# Patient Record
Sex: Male | Born: 1955 | Race: Black or African American | Hispanic: No | Marital: Single | Smoking: Former smoker
Health system: Southern US, Community
[De-identification: ages and names within clinical notes are randomized; demographics above are authoritative.]

## PROBLEM LIST (undated history)

## (undated) DIAGNOSIS — M109 Gout, unspecified: Secondary | ICD-10-CM

## (undated) DIAGNOSIS — I4891 Unspecified atrial fibrillation: Secondary | ICD-10-CM

## (undated) HISTORY — DX: Unspecified atrial fibrillation: I48.91

---

## 2019-02-15 ENCOUNTER — Other Ambulatory Visit: Payer: Self-pay

## 2019-02-15 ENCOUNTER — Emergency Department (HOSPITAL_COMMUNITY)
Admission: EM | Admit: 2019-02-15 | Discharge: 2019-02-15 | Disposition: A | Discharge status: Home / Self Care | Payer: Commercial Managed Care - PPO | Attending: Emergency Medicine | Admitting: Emergency Medicine

## 2019-02-15 ENCOUNTER — Encounter (HOSPITAL_COMMUNITY): Payer: Self-pay | Admitting: *Deleted

## 2019-02-15 DIAGNOSIS — M109 Gout, unspecified: Secondary | ICD-10-CM

## 2019-02-15 DIAGNOSIS — M10072 Idiopathic gout, left ankle and foot: Secondary | ICD-10-CM | POA: Diagnosis not present

## 2019-02-15 DIAGNOSIS — M79675 Pain in left toe(s): Secondary | ICD-10-CM | POA: Diagnosis present

## 2019-02-15 DIAGNOSIS — Z87891 Personal history of nicotine dependence: Secondary | ICD-10-CM | POA: Diagnosis not present

## 2019-02-15 MED ORDER — PREDNISONE 20 MG PO TABS: ORAL_TABLET | ORAL | 0 refills | Status: DC

## 2019-02-15 MED ORDER — COLCHICINE 0.6 MG PO TABS
1.2000 mg | ORAL_TABLET | Freq: Once | ORAL | Status: AC
  Administered 2019-02-15: 1.2 mg via ORAL
  Filled 2019-02-15: qty 2

## 2019-02-15 MED ORDER — PREDNISONE 20 MG PO TABS
60.0000 mg | ORAL_TABLET | Freq: Once | ORAL | Status: AC
  Administered 2019-02-15: 60 mg via ORAL
  Filled 2019-02-15: qty 3

## 2019-02-15 MED ORDER — OXYCODONE HCL 5 MG PO TABS
5.0000 mg | ORAL_TABLET | Freq: Once | ORAL | Status: AC
  Administered 2019-02-15: 5 mg via ORAL
  Filled 2019-02-15: qty 1

## 2019-02-15 MED ORDER — COLCHICINE 0.6 MG PO TABS: 0.6000 mg | ORAL_TABLET | Freq: Once | ORAL | 0 refills | Status: DC

## 2019-02-15 NOTE — ED Triage Notes (Signed)
Pt reports foot swelling on Friday. Pain is 8/10

## 2019-02-15 NOTE — ED Provider Notes (Signed)
[facility] EMERGENCY DEPARTMENT Provider Note   CSN: [id number] Arrival date & time: 02/15/19  1632    History   Chief Complaint Chief Complaint  Patient presents with  . Foot Pain    HPI James Sanders is a 63 y.o. male.     63 yo M with a chief complaint of left great toe pain.  This been going on for the past couple days actually gotten mildly better.  He thought about talking about it with his family doctor but by the time he thought about mentioning to the it was at the end of the day.  He denies fevers or chills denies trauma.  No history of gout previously but brother is a history of gout.  Was recently in the hospital about a month ago and discharged at [facility].  Has not yet followed up with his family doctor.   The history is provided by the patient.  Foot Pain  This is a new problem. The current episode started 2 days ago. The problem occurs constantly. The problem has been gradually improving. Pertinent negatives include no chest pain, no abdominal pain, no headaches and no shortness of breath. The symptoms are aggravated by bending and twisting. Nothing relieves the symptoms. He has tried nothing for the symptoms. The treatment provided no relief.    History reviewed. No pertinent past medical history.  There are no active problems to display for this patient.   History reviewed. No pertinent surgical history.      Home Medications    Prior to Admission medications   Medication Sig Start Date End Date Taking? Authorizing Provider  colchicine 0.6 MG tablet Take 1 tablet (0.6 mg total) by mouth once for 1 dose. Try to take 1 hour after your first dose here 02/15/19 02/15/19  Melene Plan, DO  predniSONE (DELTASONE) 20 MG tablet 2 tabs po daily x 4 days 02/15/19   Melene Plan, DO    Family History History reviewed. No pertinent family history.  Social History Social History   Tobacco Use  . Smoking status: Former Games developer  . Smokeless tobacco:  Never Used  Substance Use Topics  . Alcohol use: Not Currently  . Drug use: Never     Allergies   Patient has no allergy information on record.   Review of Systems Review of Systems  Constitutional: Negative for chills and fever.  HENT: Negative for congestion and facial swelling.   Eyes: Negative for discharge and visual disturbance.  Respiratory: Negative for shortness of breath.   Cardiovascular: Negative for chest pain and palpitations.  Gastrointestinal: Negative for abdominal pain, diarrhea and vomiting.  Musculoskeletal: Positive for arthralgias and myalgias.  Skin: Negative for color change and rash.  Neurological: Negative for tremors, syncope and headaches.  Psychiatric/Behavioral: Negative for confusion and dysphoric mood.     Physical Exam Updated Vital Signs BP (!) 153/86 (BP Location: Right Arm)   Pulse 91   Temp 97.7 F (36.5 C) (Oral)   Resp 19   Ht 6' (1.829 m)   SpO2 98%   Physical Exam Vitals signs and nursing note reviewed.  Constitutional:      Appearance: He is well-developed.  HENT:     Head: Normocephalic and atraumatic.  Eyes:     Pupils: Pupils are equal, round, and reactive to light.  Neck:     Musculoskeletal: Normal range of motion and neck supple.     Vascular: No JVD.  Cardiovascular:     Rate and  Rhythm: Normal rate and regular rhythm.     Heart sounds: No murmur. No friction rub. No gallop.   Pulmonary:     Effort: No respiratory distress.     Breath sounds: No wheezing.  Abdominal:     General: There is no distension.     Tenderness: There is no guarding or rebound.  Musculoskeletal: Normal range of motion.        General: Swelling and tenderness present.     Comments: Pain to the L MTP.  Mild swelling.  PMS intact distally.   Skin:    Coloration: Skin is not pale.     Findings: No rash.  Neurological:     Mental Status: He is alert and oriented to person, place, and time.  Psychiatric:        Behavior: Behavior  normal.      ED Treatments / Results  Labs (all labs ordered are listed, but only abnormal results are displayed) Labs Reviewed - No data to display  EKG None  Radiology No results found.  Procedures Procedures (including critical care time)  Medications Ordered in ED Medications  colchicine tablet 1.2 mg (1.2 mg Oral Given 02/15/19 1729)  predniSONE (DELTASONE) tablet 60 mg (60 mg Oral Given 02/15/19 1730)  oxyCODONE (Oxy IR/ROXICODONE) immediate release tablet 5 mg (5 mg Oral Given 02/15/19 1730)     Initial Impression / Assessment and Plan / ED Course  I have reviewed the triage vital signs and the nursing notes.  Pertinent labs & imaging results that were available during my care of the patient were reviewed by me and considered in my medical decision making (see chart for details).        63 yo M with a chief complaint of left great toe pain.  Going on for the past few days.  No fevers or chills no redness.  Is actually gotten better over the past day or so.  Likely the patient has gout as it is located in the first MTP of the left foot.  Pain with palpation.  Will start on colchicine, prednisone.  Have him follow-up with PCP.  5:38 PM:  I have discussed the diagnosis/risks/treatment options with the patient and family and believe the pt to be eligible for discharge home to follow-up with PCP. We also discussed returning to the ED immediately if new or worsening sx occur. We discussed the sx which are most concerning (e.g., sudden worsening pain, fever, inability to tolerate by mouth) that necessitate immediate return. Medications administered to the patient during their visit and any new prescriptions provided to the patient are listed below.  Medications given during this visit Medications  colchicine tablet 1.2 mg (1.2 mg Oral Given 02/15/19 1729)  predniSONE (DELTASONE) tablet 60 mg (60 mg Oral Given 02/15/19 1730)  oxyCODONE (Oxy IR/ROXICODONE) immediate release  tablet 5 mg (5 mg Oral Given 02/15/19 1730)     The patient appears reasonably screen and/or stabilized for discharge and I doubt any other medical condition or other [facility] requiring further screening, evaluation, or treatment in the ED at this time prior to discharge.    Final Clinical Impressions(s) / ED Diagnoses   Final diagnoses:  Acute gout involving toe of left foot, unspecified cause    ED Discharge Orders         Ordered    colchicine 0.6 MG tablet   Once     02/15/19 1719    predniSONE (DELTASONE) 20 MG tablet  02/15/19 1719           Melene Plan, DO 02/15/19 1738

## 2019-02-15 NOTE — Discharge Instructions (Signed)
Please establish with a family doctor to have them follow-up with your cardiac history as well as to try and keep track of your recent toe pain.  I am to start you on steroids which should help you with the inflammation.  Please return to the ED for fever rapid spreading redness.

## 2019-04-02 ENCOUNTER — Emergency Department (HOSPITAL_COMMUNITY): Payer: BLUE CROSS/BLUE SHIELD

## 2019-04-02 ENCOUNTER — Other Ambulatory Visit: Payer: Self-pay

## 2019-04-02 ENCOUNTER — Emergency Department (HOSPITAL_COMMUNITY)
Admission: EM | Admit: 2019-04-02 | Discharge: 2019-04-02 | Disposition: A | Discharge status: Home / Self Care | Payer: BLUE CROSS/BLUE SHIELD | Attending: Emergency Medicine | Admitting: Emergency Medicine

## 2019-04-02 ENCOUNTER — Encounter (HOSPITAL_COMMUNITY): Payer: Self-pay | Admitting: Emergency Medicine

## 2019-04-02 DIAGNOSIS — Z79899 Other long term (current) drug therapy: Secondary | ICD-10-CM | POA: Insufficient documentation

## 2019-04-02 DIAGNOSIS — I4891 Unspecified atrial fibrillation: Secondary | ICD-10-CM | POA: Insufficient documentation

## 2019-04-02 DIAGNOSIS — R2231 Localized swelling, mass and lump, right upper limb: Secondary | ICD-10-CM | POA: Insufficient documentation

## 2019-04-02 DIAGNOSIS — M109 Gout, unspecified: Secondary | ICD-10-CM | POA: Insufficient documentation

## 2019-04-02 DIAGNOSIS — Z87891 Personal history of nicotine dependence: Secondary | ICD-10-CM | POA: Diagnosis not present

## 2019-04-02 DIAGNOSIS — M25531 Pain in right wrist: Secondary | ICD-10-CM | POA: Diagnosis present

## 2019-04-02 DIAGNOSIS — M25431 Effusion, right wrist: Secondary | ICD-10-CM

## 2019-04-02 HISTORY — DX: Gout, unspecified: M10.9

## 2019-04-02 HISTORY — PX: IR FLUORO GUIDED NEEDLE PLC ASPIRATION/INJECTION LOC: IMG2395

## 2019-04-02 LAB — APTT: aPTT: 32 seconds (ref 24–36)

## 2019-04-02 LAB — BASIC METABOLIC PANEL
Anion gap: 9 (ref 5–15)
BUN: 16 mg/dL (ref 6–20)
CO2: 25 mmol/L (ref 22–32)
Calcium: 9.4 mg/dL (ref 8.9–10.3)
Chloride: 105 mmol/L (ref 98–111)
Creatinine, Ser: 1.45 mg/dL — ABNORMAL HIGH (ref 0.61–1.24)
GFR calc Af Amer: >60 mL/min (ref >60)
GFR calc non Af Amer: 52 mL/min — ABNORMAL LOW (ref >60)
Glucose, Bld: 124 mg/dL — ABNORMAL HIGH (ref 70–99)
Potassium: 3.9 mmol/L (ref 3.5–5.1)
Sodium: 139 mmol/L (ref 135–145)

## 2019-04-02 LAB — SEDIMENTATION RATE: Sed Rate: 25 mm/hr — ABNORMAL HIGH (ref 0–16)

## 2019-04-02 LAB — CBC WITH DIFFERENTIAL/PLATELET
Abs Immature Granulocytes: 0.03 10*3/uL (ref 0.00–0.07)
Basophils Absolute: 0.1 10*3/uL (ref 0.0–0.1)
Basophils Relative: 1 %
Eosinophils Absolute: 0.2 10*3/uL (ref 0.0–0.5)
Eosinophils Relative: 2 %
HCT: 38.8 % — ABNORMAL LOW (ref 39.0–52.0)
Hemoglobin: 12.7 g/dL — ABNORMAL LOW (ref 13.0–17.0)
Immature Granulocytes: 0 %
Lymphocytes Relative: 29 %
Lymphs Abs: 2 10*3/uL (ref 0.7–4.0)
MCH: 28.9 pg (ref 26.0–34.0)
MCHC: 32.7 g/dL (ref 30.0–36.0)
MCV: 88.4 fL (ref 80.0–100.0)
Monocytes Absolute: 0.8 10*3/uL (ref 0.1–1.0)
Monocytes Relative: 11 %
Neutro Abs: 3.7 10*3/uL (ref 1.7–7.7)
Neutrophils Relative %: 57 %
Platelets: 179 10*3/uL (ref 150–400)
RBC: 4.39 MIL/uL (ref 4.22–5.81)
RDW: 13.8 % (ref 11.5–15.5)
WBC: 6.7 10*3/uL (ref 4.0–10.5)
nRBC: 0 % (ref 0.0–0.2)

## 2019-04-02 LAB — URIC ACID: Uric Acid, Serum: 9.7 mg/dL — ABNORMAL HIGH (ref 3.7–8.6)

## 2019-04-02 LAB — C-REACTIVE PROTEIN: CRP: <0.8 mg/dL (ref <1.0)

## 2019-04-02 LAB — PROTIME-INR
INR: 2 — ABNORMAL HIGH (ref 0.8–1.2)
Prothrombin Time: 22.7 seconds — ABNORMAL HIGH (ref 11.4–15.2)

## 2019-04-02 LAB — LACTIC ACID, PLASMA: Lactic Acid, Venous: 1.4 mmol/L (ref 0.5–1.9)

## 2019-04-02 MED ORDER — HYDROCODONE-ACETAMINOPHEN 5-325 MG PO TABS: 1.0000 | ORAL_TABLET | Freq: Four times a day (QID) | ORAL | 0 refills | Status: DC | PRN

## 2019-04-02 MED ORDER — LIDOCAINE HCL 1 % IJ SOLN
INTRAMUSCULAR | Status: DC | PRN
  Administered 2019-04-02: 1 mL

## 2019-04-02 MED ORDER — HYDROCODONE-ACETAMINOPHEN 5-325 MG PO TABS
1.0000 | ORAL_TABLET | Freq: Once | ORAL | Status: AC
  Administered 2019-04-02: 1 via ORAL
  Filled 2019-04-02: qty 1

## 2019-04-02 MED ORDER — LIDOCAINE HCL 1 % IJ SOLN
INTRAMUSCULAR | Status: AC
  Filled 2019-04-02: qty 20

## 2019-04-02 MED ORDER — PREDNISONE 20 MG PO TABS: 40.0000 mg | ORAL_TABLET | Freq: Every day | ORAL | 0 refills | Status: DC

## 2019-04-02 NOTE — Discharge Instructions (Signed)
It was my pleasure taking care of you today!   Take prednisone daily.  Norco only as needed for severe pain.   If you are not feeling better by Monday, call your primary care doctor.  You can always return to ER if needed. Please return should you develop a fever, swelling is getting worse or you have any additional concerns.

## 2019-04-02 NOTE — ED Provider Notes (Signed)
[facility] EMERGENCY DEPARTMENT Provider Note   CSN: [id number] Arrival date & time: 04/02/19  1059  History   Chief Complaint Right wrist pain  HPI James Sanders is a 63 y.o. male with past medical history significant for Afib (Xarelto) who presents for evaluation of wrist pain. Pain with 2 days of right wrist pain, swelling and warmth x 2 days. Denies injury or trauma. Does have history of gout in left great toe x 1. Denies fever, chills, nausea, vomiting, numbness or tingling, CP, SOB, Cough, ABD pain, bilateral joint pain, discharge. States he has decreased ROM to his right wrist since the pain began. Rates his pain a 7/10. Denies radiation of pain. Pain located to dorsal aspect of right wrist. Has not taken anything for his pain. Symptoms have been constant in nature.  History obtained from patient. No interpretor was used.     HPI  Past Medical History:  Diagnosis Date   Gout     There are no active problems to display for this patient.   No past surgical history on file.      Home Medications    Prior to Admission medications   Medication Sig Start Date End Date Taking? Authorizing Provider  amiodarone (PACERONE) 200 MG tablet Take 200 mg by mouth daily. 01/29/19 01/29/20 Yes [provider]  atorvastatin (LIPITOR) 40 MG tablet Take 40 mg by mouth at bedtime. 01/28/19 01/28/20 Yes [provider]  lisinopril (ZESTRIL) 5 MG tablet Take 5 mg by mouth daily. 01/29/19 01/29/20 Yes [provider]  metoprolol succinate (TOPROL-XL) 50 MG 24 hr tablet Take 50 mg by mouth every 12 (twelve) hours. 01/28/19  Yes [provider]  torsemide (DEMADEX) 20 MG tablet Take 20 mg by mouth daily. 01/29/19 01/29/20 Yes [provider]  XARELTO 20 MG TABS tablet Take 20 mg by mouth every evening. 02/16/19  Yes [provider]  colchicine 0.6 MG tablet Take 1 tablet (0.6 mg total) by mouth once for 1 dose. Try to take 1 hour  after your first dose here Patient not taking: Reported on 04/02/2019 02/15/19 02/15/19  Melene Plan, DO    Family History No family history on file.  Social History Social History   Tobacco Use   Smoking status: Former Smoker   Smokeless tobacco: Never Used  Substance Use Topics   Alcohol use: Yes    Comment: social   Drug use: Yes    Types: Marijuana     Allergies   Patient has no known allergies.   Review of Systems Review of Systems  Constitutional: Negative.   HENT: Negative.   Respiratory: Negative.   Cardiovascular: Negative.   Gastrointestinal: Negative.   Genitourinary: Negative.   Musculoskeletal:       Right wrist pain  Skin: Negative.   Neurological: Negative.   All other systems reviewed and are negative.    Physical Exam Updated Vital Signs BP (!) 141/84 (BP Location: Left Arm)    Pulse (!) 43    Temp 98.4 F (36.9 C) (Oral)    Resp 15    SpO2 99%   Physical Exam Vitals signs and nursing note reviewed.  Constitutional:      General: He is not in acute distress.    Appearance: He is well-developed. He is not ill-appearing, toxic-appearing or diaphoretic.  HENT:     Head: Normocephalic and atraumatic.     Mouth/Throat:     Mouth: Mucous membranes are moist.  Pharynx: Oropharynx is clear.  Eyes:     Pupils: Pupils are equal, round, and reactive to light.  Neck:     Musculoskeletal: Normal range of motion and neck supple.  Cardiovascular:     Rate and Rhythm: Normal rate and regular rhythm.     Pulses: Normal pulses.     Heart sounds: Normal heart sounds. No murmur. No friction rub. No gallop.   Pulmonary:     Effort: Pulmonary effort is normal. No respiratory distress.     Breath sounds: Normal breath sounds. No stridor. No wheezing, rhonchi or rales.  Chest:     Chest wall: No tenderness.  Abdominal:     General: Bowel sounds are normal. There is no distension.     Palpations: Abdomen is soft.     Tenderness: There is no abdominal  tenderness. There is no guarding or rebound.  Musculoskeletal: Normal range of motion.     Comments: Decreased ROM to right wrist with flexion and extension. FROM to left wrist without difficulty. Mild swelling to dorsal aspect of right wrist with mild warmth. No bony tenderness to BUE.  Skin:    General: Skin is warm and dry.     Comments: Mild edema with warmth to right wrist.  No overlying fluctuance or induration.  No lacerations, rashes or lesions.  No vesicles or bulla.  Brisk capillary refill.  Neurological:     Mental Status: He is alert.     Comments: Decreased grip strength to right upper extremity secondary to pain. Cranials 2 through 12 grossly intact.  No dysphasia.         ED Treatments / Results  Labs (all labs ordered are listed, but only abnormal results are displayed) Labs Reviewed  CBC WITH DIFFERENTIAL/PLATELET - Abnormal; Notable for the following components:      Result Value   Hemoglobin 12.7 (*)    HCT 38.8 (*)    All other components within normal limits  BASIC METABOLIC PANEL - Abnormal; Notable for the following components:   Glucose, Bld 124 (*)    Creatinine, Ser 1.45 (*)    GFR calc non Af Amer 52 (*)    All other components within normal limits  SEDIMENTATION RATE - Abnormal; Notable for the following components:   Sed Rate 25 (*)    All other components within normal limits  URIC ACID - Abnormal; Notable for the following components:   Uric Acid, Serum 9.7 (*)    All other components within normal limits  PROTIME-INR - Abnormal; Notable for the following components:   Prothrombin Time 22.7 (*)    INR 2.0 (*)    All other components within normal limits  BODY FLUID CULTURE  GRAM STAIN  C-REACTIVE PROTEIN  LACTIC ACID, PLASMA  APTT  LACTIC ACID, PLASMA  GLUCOSE, BODY FLUID OTHER  PROTEIN, BODY FLUID (OTHER)  SYNOVIAL CELL COUNT + DIFF, W/ CRYSTALS  URIC ACID, BODY FLUID    EKG None  Radiology Dg Wrist Complete  Right  Result Date: 04/02/2019 CLINICAL DATA:  Per patient reports general right wrist pain, per patient reports he has had gout in his foot, reports it may have gout in his right wrist. Medial and lateral right wrist pain, limited mobility due to pain. wrist pain EXAM: RIGHT WRIST - COMPLETE 3+ VIEW COMPARISON:  None. FINDINGS: No distal radius or ulnar fracture. Radiocarpal joint is intact. No carpal fracture. No soft tissue abnormality. IMPRESSION: No acute osseous findings.  No significant arthropathy identified.  Electronically Signed   By: Genevive Bi M.D.   On: 04/02/2019 11:31    Procedures Procedures (including critical care time)  Medications Ordered in ED Medications  lidocaine (XYLOCAINE) 1 % (with pres) injection (has no administration in time range)  lidocaine (XYLOCAINE) 1 % (with pres) injection (1 mL Infiltration Given 04/02/19 1558)  HYDROcodone-acetaminophen (NORCO/VICODIN) 5-325 MG per tablet 1 tablet (1 tablet Oral Given 04/02/19 1416)     Initial Impression / Assessment and Plan / ED Course  I have reviewed the triage vital signs and the nursing notes.  Pertinent labs & imaging results that were available during my care of the patient were reviewed by me and considered in my medical decision making (see chart for details).  63 year old male with acute onset swollen, red, warm, painful red wrist.  Afebrile, nonseptic, non-ill-appearing.  Patient is on anticoagulation, Xarelto for persistent PE.  Denies chest 1.14 or shortness of breath.  Does have decreased range of motion with flexion and extension to his right wrist.  On exam there is some mild erythema, mild swelling with warmth compared to left wrist.  He does have history of gout however in his right great toe.  He has never had fluid aspiration performed to confirm gout diagnosis previously.  Heart and lungs clear.  Possible concern for septic joint versus gout flare.  Will obtain labs, consult hand surgery and  reevaluate.  Labs and imaging personally reviewed: CBC without leukocytosis BMP with likely elevated creatine at 1.42, GFR >60 Uric acid elevated at 9.7 Sed rate 25, CRP non elevated, lactic acid 1.4  1130: Consulted with Dr. Janee Morn, Hand surgery and Rodessa PA. Request Fluid collect to assess for septic joint, If positive he request we formally page him for consult and he will evaluate patient.   1500: IR physician and Twanna Hy has evaluated patient with [place]. No fluid collection visualized, unable to aspirate given no visualized fluid collection. No evidence of joint effusion. Low suspicion for septic joint. Likely gout flair.  1550: Notified by Radiology they were able to collect fluid from joint. Will place orders for arthrocentesis labs.  If no evidence of septic joint treat for likely gout. If fluid with evidence of septic joint will need consult with hand surgery.  Patient care transferred to Ward PA-C who will determine ultimate treatment, plan and disposition.  Patient has been seen and evaluated by attending physician Dr. Lockie Mola.       Final Clinical Impressions(s) / ED Diagnoses   Final diagnoses:  Wrist swelling, right    ED Discharge Orders    None       Javone Ybanez A, PA-C 04/02/19 1606    Curatolo, Adam, DO 04/02/19 1628

## 2019-04-02 NOTE — Progress Notes (Signed)
   Risks and benefits of right wrist aspiration was discussed with the patient and/or patient's family including, but not limited to bleeding, infection, damage to adjacent structures or low yield requiring additional tests.  All of the questions were answered and there is agreement to proceed.  Consent signed and in chart. Patient ID: James Sanders, male   DOB: 1956/05/12, 63 y.o.   MRN: [id number]

## 2019-04-02 NOTE — ED Provider Notes (Signed)
Care assumed from previous provider PA Henderly. Please see note for further details. Case discussed, plan agreed upon.  Fully, 63-year-old male who presented with wrist pain and swelling.  Per previous provider, gout versus septic joint.  Will follow up on pending fluid analysis.   Notified by lab that there was only enough fluid to run culture. Not going to be able to run the remaining ordered labs. Discussed with attending, Dr. Madilyn Hook. Recommends discussing with ortho.   Spoke with Dr. Janee Morn. Discussed all labs resulted thus far and inability to obtain further fluid analysis. Will treat as gout flare and have him follow up with PCP. I discussed return precautions with him at length. Strict return precautions especially if swelling worsens or fever develops. All questions answered.    Pat Elicker, Chase Picket, PA-C 04/02/19 1839    Tilden Fossa, MD 04/03/19 2314

## 2019-04-02 NOTE — ED Triage Notes (Signed)
Patient reports R wrist pain and swelling x 2 days. States he was in here 2 months ago and diagnosed with gout to L foot, pain similar. Denies injury. Swelling and redness noted to R wrist.

## 2019-04-02 NOTE — ED Provider Notes (Signed)
Medical screening examination/treatment/procedure(s) were conducted as a shared visit with non-physician practitioner(s) and myself.  I personally evaluated the patient during the encounter. Briefly, the patient is a 62 y.o. male with history of gout who presents the ED with right wrist pain for the past 2 days.  Patient with unremarkable vitals.  No fever.  Patient was diagnosed with gout of the left foot in the past.  Similar pain to the right wrist today.  No history of the same.  Patient is right-handed.  Denies any history of diabetes.  Has some tenderness over the ulnar side of the right wrist joint.  Does not have any major swelling or erythema.  Has some pain with range of motion of the wrist but overall is able to range his wrist fairly well.  No obvious overlying cellulitis.  PA asked me to evaluate the patient due to concern for gout versus septic joint.  PA states that they talked with hand who states that without fluid analysis they are unable to consult.  Patient overall has lab work that is not suggestive of infectious process.  Normal white count.  Normal ESR and CRP.  Patient does not have fever.  X-ray did not show any effusion or fracture.  Overall low suspicion for septic joint.  Possibly gout.  Will talk with IR to see if they are willing to take a look with fluoroscopy to see if there is any fluid for analysis to distinguish between gout and septic joint.  IR was able to get fluid from the wrist for analysis.  Patient awaiting fluid analysis to evaluate for gout versus septic joint.  Signed out to oncoming ED staff.  This chart was dictated using voice recognition software.  Despite best efforts to proofread,  errors can occur which can change the documentation meaning.    EKG Interpretation None           Curatolo, Adam, DO 04/02/19 1621  

## 2019-04-02 NOTE — Procedures (Signed)
Pre Procedure Dx: Right wrist swelling Post Procedural Dx: Same  Technically successful US guided right wrist aspiration yielding 2 cc of serous, viscous though non-cloudy joint fluid.  EBL: None  No immediate complications.   Katherina Right, MD Pager #: [phone]

## 2019-04-02 NOTE — ED Notes (Signed)
bt went to IR

## 2019-04-05 LAB — BODY FLUID CULTURE
Culture: NO GROWTH
Special Requests: NORMAL

## 2019-04-29 ENCOUNTER — Other Ambulatory Visit: Payer: Self-pay

## 2019-04-30 ENCOUNTER — Ambulatory Visit: Payer: BLUE CROSS/BLUE SHIELD | Admitting: Family Medicine

## 2019-04-30 ENCOUNTER — Encounter: Payer: Self-pay | Admitting: Family Medicine

## 2019-04-30 VITALS — BP 130/80 | HR 73 | Temp 97.5°F | Ht 72.0 in | Wt 223.1 lb

## 2019-04-30 DIAGNOSIS — E790 Hyperuricemia without signs of inflammatory arthritis and tophaceous disease: Secondary | ICD-10-CM | POA: Insufficient documentation

## 2019-04-30 DIAGNOSIS — M10471 Other secondary gout, right ankle and foot: Secondary | ICD-10-CM

## 2019-04-30 DIAGNOSIS — D649 Anemia, unspecified: Secondary | ICD-10-CM

## 2019-04-30 MED ORDER — COLCHICINE 0.6 MG PO TABS: 0.6000 mg | ORAL_TABLET | Freq: Every day | ORAL | 1 refills | Status: DC

## 2019-04-30 MED ORDER — PREDNISONE 20 MG PO TABS: 20.0000 mg | ORAL_TABLET | Freq: Two times a day (BID) | ORAL | 0 refills | Status: AC

## 2019-04-30 MED ORDER — KETOROLAC TROMETHAMINE 60 MG/2ML IM SOLN
60.0000 mg | Freq: Once | INTRAMUSCULAR | Status: AC
  Administered 2019-04-30: 60 mg via INTRAMUSCULAR

## 2019-04-30 NOTE — Progress Notes (Signed)
Established Patient Office Visit  Subjective:  Patient ID: James Sanders, male    DOB: [birth year]  Age: 63 y.o. MRN: [id number]  CC:  Chief Complaint  Patient presents with  . Establish Care  . Gout    HPI James Sanders presents for establishment of care and for treatment of acute episode of gout.  Has has experienced multiple episodes in the last few months.  Status post recent hospitalization with a gouty flare in his right wrist.  Laboratory review of that episode shows negative synovial fluid cultures with an elevated uric acid.  Patient is currently being followed by [facility] cardiology for what appears to be hypertension and possible CHF.  He is not absolutely certain.  He has follow-up appointment with them this coming Thursday.  He has no history of coronary artery disease that he is aware of at this time.  Patient admits to enjoying his beer, red meat and shellfish.  He does enjoy cigars and smokes marijuana on occasion.  Past Medical History:  Diagnosis Date  . Gout     Past Surgical History:  Procedure Laterality Date  . IR FLUORO GUIDED NEEDLE PLC ASPIRATION/INJECTION LOC  04/02/2019    History reviewed. No pertinent family history.  Social History   Socioeconomic History  . Marital status: Single    Spouse name: Not on file  . Number of children: Not on file  . Years of education: Not on file  . Highest education level: Not on file  Occupational History  . Not on file  Social Needs  . Financial resource strain: Not on file  . Food insecurity    Worry: Not on file    Inability: Not on file  . Transportation needs    Medical: Not on file    Non-medical: Not on file  Tobacco Use  . Smoking status: Former Research scientist (life sciences)  . Smokeless tobacco: Never Used  Substance and Sexual Activity  . Alcohol use: Yes    Comment: social  . Drug use: Yes    Types: Marijuana  . Sexual activity: Not on file  Lifestyle  . Physical activity    Days per week: Not on file   Minutes per session: Not on file  . Stress: Not on file  Relationships  . Social Herbalist on phone: Not on file    Gets together: Not on file    Attends religious service: Not on file    Active member of club or organization: Not on file    Attends meetings of clubs or organizations: Not on file    Relationship status: Not on file  . Intimate partner violence    Fear of current or ex partner: Not on file    Emotionally abused: Not on file    Physically abused: Not on file    Forced sexual activity: Not on file  Other Topics Concern  . Not on file  Social History Narrative  . Not on file    Outpatient Medications Prior to Visit  Medication Sig Dispense Refill  . amiodarone (PACERONE) 200 MG tablet Take 200 mg by mouth daily.    Marland Kitchen atorvastatin (LIPITOR) 40 MG tablet Take 40 mg by mouth at bedtime.    Marland Kitchen HYDROcodone-acetaminophen (NORCO) 5-325 MG tablet Take 1 tablet by mouth every 6 (six) hours as needed for moderate pain. 12 tablet 0  . lisinopril (ZESTRIL) 5 MG tablet Take 5 mg by mouth daily.    . metoprolol succinate (TOPROL-XL) 50  MG 24 hr tablet Take 50 mg by mouth every 12 (twelve) hours.    . torsemide (DEMADEX) 20 MG tablet Take 20 mg by mouth daily.    Carlena Hurl. XARELTO 20 MG TABS tablet Take 20 mg by mouth every evening.    . predniSONE (DELTASONE) 20 MG tablet Take 2 tablets (40 mg total) by mouth daily. 10 tablet 0  . colchicine 0.6 MG tablet Take 1 tablet (0.6 mg total) by mouth once for 1 dose. Try to take 1 hour after your first dose here (Patient not taking: Reported on 04/02/2019) 1 tablet 0   No facility-administered medications prior to visit.     No Known Allergies  ROS Review of Systems  Constitutional: Negative for chills, diaphoresis, fatigue, fever and unexpected weight change.  Respiratory: Negative for chest tightness, shortness of breath and wheezing.   Cardiovascular: Negative for chest pain and palpitations.  Gastrointestinal: Negative.    Genitourinary: Negative.   Musculoskeletal: Positive for arthralgias and gait problem.  Skin: Negative for pallor and rash.  Allergic/Immunologic: Negative for immunocompromised state.  Neurological: Negative for weakness and numbness.  Psychiatric/Behavioral: Negative.       Objective:    Physical Exam  Constitutional: He is oriented to person, place, and time. He appears well-developed and well-nourished. No distress.  HENT:  Head: Normocephalic and atraumatic.  Right Ear: External ear normal.  Left Ear: External ear normal.  Eyes: Right eye exhibits no discharge. Left eye exhibits no discharge. No scleral icterus.  Neck: No JVD present. No tracheal deviation present.  Cardiovascular: Normal rate, regular rhythm and normal heart sounds.  Pulses:      Dorsalis pedis pulses are 2+ on the right side.  Pulmonary/Chest: Effort normal and breath sounds normal. No stridor.  Abdominal: Bowel sounds are normal.  Musculoskeletal:     Right foot: Tenderness and swelling present.       Feet:  Neurological: He is alert and oriented to person, place, and time.  Skin: Skin is warm and dry. He is not diaphoretic.  Psychiatric: He has a normal mood and affect.    BP 130/80   Pulse 73   Temp (!) 97.5 F (36.4 C) (Oral)   Ht 6' (1.829 m)   Wt 223 lb 2 oz (101.2 kg)   SpO2 98%   BMI 30.26 kg/m  Wt Readings from Last 3 Encounters:  04/30/19 223 lb 2 oz (101.2 kg)     Health Maintenance Due  Topic Date Due  . Hepatitis C Screening  08-16-1956  . HIV Screening  05/09/1973  . TETANUS/TDAP  05/09/1977  . COLONOSCOPY  05/09/2008    There are no preventive care reminders to display for this patient.  No results found for: TSH Lab Results  Component Value Date   WBC 6.7 04/02/2019   HGB 12.7 (L) 04/02/2019   HCT 38.8 (L) 04/02/2019   MCV 88.4 04/02/2019   PLT 179 04/02/2019   Lab Results  Component Value Date   NA 139 04/02/2019   K 3.9 04/02/2019   CO2 25 04/02/2019    GLUCOSE 124 (H) 04/02/2019   BUN 16 04/02/2019   CREATININE 1.45 (H) 04/02/2019   CALCIUM 9.4 04/02/2019   ANIONGAP 9 04/02/2019   No results found for: CHOL No results found for: HDL No results found for: LDLCALC No results found for: TRIG No results found for: CHOLHDL No results found for: [id number]    Assessment & Plan:   Problem List Items Addressed This Visit  Other   Gout - Primary   Relevant Medications   predniSONE (DELTASONE) 20 MG tablet   colchicine 0.6 MG tablet   Other Relevant Orders   CBC   Comprehensive metabolic panel   Uric acid    Other Visit Diagnoses    Anemia, unspecified type       Relevant Orders   CBC   Iron, TIBC and Ferritin Panel   Vitamin B12      Meds ordered this encounter  Medications  . ketorolac (TORADOL) injection 60 mg  . predniSONE (DELTASONE) 20 MG tablet    Sig: Take 1 tablet (20 mg total) by mouth 2 (two) times daily with a meal for 7 days.    Dispense:  14 tablet    Refill:  0  . colchicine 0.6 MG tablet    Sig: Take 1 tablet (0.6 mg total) by mouth daily.    Dispense:  30 tablet    Refill:  1    Follow-up: Return in about 2 weeks (around 05/14/2019).   Patient will follow-up with [facility] cardiology for management of his hypertension and other cardiac concerns.  Limited to lower dose lower dose colchicine because of potential for interaction with amiodarone.  Follow-up will be in 2 weeks for hopefully we can start allopurinol for control of his gout.  Patient was given information on gout and a low purine diet.  He will stop drinking beer.

## 2019-04-30 NOTE — Addendum Note (Signed)
Addended by: Lynnea Ferrier on: 04/30/2019 02:32 PM   Modules accepted: Orders

## 2019-04-30 NOTE — Patient Instructions (Signed)
Gout    Gout is a condition that causes painful swelling of the joints. Gout is a type of inflammation of the joints (arthritis). This condition is caused by having too much uric acid in the body. Uric acid is a chemical that forms when the body breaks down substances called purines. Purines are important for building body proteins.  When the body has too much uric acid, sharp crystals can form and build up inside the joints. This causes pain and swelling. Gout attacks can happen quickly and may be very painful (acute gout). Over time, the attacks can affect more joints and become more frequent (chronic gout). Gout can also cause uric acid to build up under the skin and inside the kidneys.  What are the causes?  This condition is caused by too much uric acid in your blood. This can happen because:   Your kidneys do not remove enough uric acid from your blood. This is the most common cause.   Your body makes too much uric acid. This can happen with some cancers and cancer treatments. It can also occur if your body is breaking down too many red blood cells (hemolytic anemia).   You eat too many foods that are high in purines. These foods include organ meats and some seafood. Alcohol, especially beer, is also high in purines.  A gout attack may be triggered by trauma or stress.  What increases the risk?  You are more likely to develop this condition if you:   Have a family history of gout.   Are male and middle-aged.   Are male and have gone through menopause.   Are obese.   Frequently drink alcohol, especially beer.   Are dehydrated.   Lose weight too quickly.   Have an organ transplant.   Have lead poisoning.   Take certain medicines, including aspirin, cyclosporine, diuretics, levodopa, and niacin.   Have kidney disease.   Have a skin condition called psoriasis.  What are the signs or symptoms?  An attack of acute gout happens quickly. It usually occurs in just one joint. The most common place is  the big toe. Attacks often start at night. Other joints that may be affected include joints of the feet, ankle, knee, fingers, wrist, or elbow. Symptoms of this condition may include:   Severe pain.   Warmth.   Swelling.   Stiffness.   Tenderness. The affected joint may be very painful to touch.   Shiny, red, or purple skin.   Chills and fever.  Chronic gout may cause symptoms more frequently. More joints may be involved. You may also have white or yellow lumps (tophi) on your hands or feet or in other areas near your joints.  How is this diagnosed?  This condition is diagnosed based on your symptoms, medical history, and physical exam. You may have tests, such as:   Blood tests to measure uric acid levels.   Removal of joint fluid with a thin needle (aspiration) to look for uric acid crystals.   X-rays to look for joint damage.  How is this treated?  Treatment for this condition has two phases: treating an acute attack and preventing future attacks. Acute gout treatment may include medicines to reduce pain and swelling, including:   NSAIDs.   Steroids. These are strong anti-inflammatory medicines that can be taken by mouth (orally) or injected into a joint.   Colchicine. This medicine relieves pain and swelling when it is taken soon after an attack. It   can be given by mouth or through an IV.  Preventive treatment may include:   Daily use of smaller doses of NSAIDs or colchicine.   Use of a medicine that reduces uric acid levels in your blood.   Changes to your diet. You may need to see a dietitian about what to eat and drink to prevent gout.  Follow these instructions at home:  During a gout attack     If directed, put ice on the affected area:  ? Put ice in a plastic bag.  ? Place a towel between your skin and the bag.  ? Leave the ice on for 20 minutes, 2-3 times a day.   Raise (elevate) the affected joint above the level of your heart as often as possible.   Rest the joint as much as possible.  If the affected joint is in your leg, you may be given crutches to use.   Follow instructions from your health care provider about eating or drinking restrictions.  Avoiding future gout attacks   Follow a low-purine diet as told by your dietitian or health care provider. Avoid foods and drinks that are high in purines, including liver, kidney, anchovies, asparagus, herring, mushrooms, mussels, and beer.   Maintain a healthy weight or lose weight if you are overweight. If you want to lose weight, talk with your health care provider. It is important that you do not lose weight too quickly.   Start or maintain an exercise program as told by your health care provider.  Eating and drinking   Drink enough fluids to keep your urine pale yellow.   If you drink alcohol:  ? Limit how much you use to:   0-1 drink a day for women.   0-2 drinks a day for men.  ? Be aware of how much alcohol is in your drink. In the [place] one drink equals one 12 oz bottle of beer (355 mL) one 5 oz glass of wine (148 mL), or one 1 oz glass of hard liquor (44 mL).  General instructions   Take over-the-counter and prescription medicines only as told by your health care provider.   Do not drive or use heavy machinery while taking prescription pain medicine.   Return to your normal activities as told by your health care provider. Ask your health care provider what activities are safe for you.   Keep all follow-up visits as told by your health care provider. This is important.  Contact a health care provider if you have:   Another gout attack.   Continuing symptoms of a gout attack after 10 days of treatment.   Side effects from your medicines.   Chills or a fever.   Burning pain when you urinate.   Pain in your lower back or belly.  Get help right away if you:   Have severe or uncontrolled pain.   Cannot urinate.  Summary   Gout is painful swelling of the joints caused by inflammation.   The most common site of pain is the big  toe, but it can affect other joints in the body.   Medicines and dietary changes can help to prevent and treat gout attacks.  This information is not intended to replace advice given to you by your health care provider. Make sure you discuss any questions you have with your health care provider.  Document Released: 11/01/2000 Document Revised: 05/27/2018 Document Reviewed: 05/27/2018  Elsevier Interactive Patient Education  2019 [employer]      Low-Purine Eating Plan  A low-purine eating plan involves making food choices to limit your intake of purine. Purine is a kind of uric acid. Too much uric acid in your blood can cause certain conditions, such as gout and kidney stones. Eating a low-purine diet can help control these conditions.  What are tips for following this plan?  Reading food labels     Avoid foods with saturated or Trans fat.   Check the ingredient list of grains-based foods, such as bread and cereal, to make sure that they contain whole grains.   Check the ingredient list of sauces or soups to make sure they do not contain meat or fish.   When choosing soft drinks, check the ingredient list to make sure they do not contain high-fructose corn syrup.  Shopping   Buy plenty of fresh fruits and vegetables.   Avoid buying canned or fresh fish.   Buy dairy products labeled as low-fat or nonfat.   Avoid buying premade or processed foods. These foods are often high in fat, salt (sodium), and added sugar.  Cooking   Use olive oil instead of butter when cooking. Oils like olive oil, canola oil, and sunflower oil contain healthy fats.  Meal planning   Learn which foods do or do not affect you. If you find out that a food tends to cause your gout symptoms to flare up, avoid eating that food. You can enjoy foods that do not cause problems. If you have any questions about a food item, talk with your dietitian or health care provider.   Limit foods high in fat, especially saturated fat. Fat makes it  harder for your body to get rid of uric acid.   Choose foods that are lower in fat and are lean sources of protein.  General guidelines   Limit alcohol intake to no more than 1 drink a day for nonpregnant women and 2 drinks a day for men. One drink equals 12 oz of beer, 5 oz of wine, or 1 oz of hard liquor. Alcohol can affect the way your body gets rid of uric acid.   Drink plenty of water to keep your urine clear or pale yellow. Fluids can help remove uric acid from your body.   If directed by your health care provider, take a vitamin C supplement.   Work with your health care provider and dietitian to develop a plan to achieve or maintain a healthy weight. Losing weight can help reduce uric acid in your blood.  What foods are recommended?  The items listed may not be a complete list. Talk with your dietitian about what dietary choices are best for you.  Foods low in purines  Foods low in purines do not need to be limited. These include:   All fruits.   All low-purine vegetables, pickles, and olives.   Breads, pasta, rice, cornbread, and popcorn. Cake and other baked goods.   All dairy foods.   Eggs, nuts, and nut butters.   Spices and condiments, such as salt, herbs, and vinegar.   Plant oils, butter, and margarine.   Water, sugar-free soft drinks, tea, coffee, and cocoa.   Vegetable-based soups, broths, sauces, and gravies.  Foods moderate in purines  Foods moderate in purines should be limited to the amounts listed.    cup of asparagus, cauliflower, spinach, mushrooms, or green peas, each day.   2/3 cup uncooked oatmeal, each day.    cup dry wheat bran or wheat germ, each day.     2-3 ounces of meat or poultry, each day.   4-6 ounces of shellfish, such as crab, lobster, oysters, or shrimp, each day.   1 cup cooked beans, peas, or lentils, each day.   Soup, broths, or bouillon made from meat or fish. Limit these foods as much as possible.  What foods are not recommended?  The items listed  may not be a complete list. Talk with your dietitian about what dietary choices are best for you.  Limit your intake of foods high in purines, including:   Beer and other alcohol.   Meat-based gravy or sauce.   Canned or fresh fish, such as:  ? Anchovies, sardines, herring, and tuna.  ? Mussels and scallops.  ? Codfish, trout, and haddock.   Bacon.   Organ meats, such as:  ? Liver or kidney.  ? Tripe.  ? Sweetbreads (thymus gland or pancreas).   Wild game or goose.   Yeast or yeast extract supplements.   Drinks sweetened with high-fructose corn syrup.  Summary   Eating a low-purine diet can help control conditions caused by too much uric acid in the body, such as gout or kidney stones.   Choose low-purine foods, limit alcohol, and limit foods high in fat.   You will learn over time which foods do or do not affect you. If you find out that a food tends to cause your gout symptoms to flare up, avoid eating that food.  This information is not intended to replace advice given to you by your health care provider. Make sure you discuss any questions you have with your health care provider.  Document Released: 03/01/2011 Document Revised: 12/18/2016 Document Reviewed: 12/18/2016  Elsevier Interactive Patient Education  2019 [employer]

## 2019-05-01 LAB — COMPREHENSIVE METABOLIC PANEL
AG Ratio: 1.1 (calc) (ref 1.0–2.5)
ALT: 21 U/L (ref 9–46)
AST: 23 U/L (ref 10–35)
Albumin: 4.2 g/dL (ref 3.6–5.1)
Alkaline phosphatase (APISO): 112 U/L (ref 35–144)
BUN/Creatinine Ratio: 12 (calc) (ref 6–22)
BUN: 26 mg/dL — ABNORMAL HIGH (ref 7–25)
CO2: 24 mmol/L (ref 20–32)
Calcium: 10.1 mg/dL (ref 8.6–10.3)
Chloride: 104 mmol/L (ref 98–110)
Creat: 2.19 mg/dL — ABNORMAL HIGH (ref 0.70–1.25)
Globulin: 3.8 g/dL (calc) — ABNORMAL HIGH (ref 1.9–3.7)
Glucose, Bld: 97 mg/dL (ref 65–99)
Potassium: 4 mmol/L (ref 3.5–5.3)
Sodium: 141 mmol/L (ref 135–146)
Total Bilirubin: 1.2 mg/dL (ref 0.2–1.2)
Total Protein: 8 g/dL (ref 6.1–8.1)

## 2019-05-01 LAB — IRON,TIBC AND FERRITIN PANEL
%SAT: 19 % (calc) — ABNORMAL LOW (ref 20–48)
Ferritin: 339 ng/mL (ref 24–380)
Iron: 53 ug/dL (ref 50–180)
TIBC: 273 mcg/dL (calc) (ref 250–425)

## 2019-05-01 LAB — CBC

## 2019-05-01 LAB — VITAMIN B12: Vitamin B-12: 441 pg/mL (ref 200–1100)

## 2019-05-01 LAB — URIC ACID: Uric Acid, Serum: 10.4 mg/dL — ABNORMAL HIGH (ref 4.0–8.0)

## 2019-05-03 ENCOUNTER — Telehealth: Payer: Self-pay

## 2019-05-03 NOTE — Telephone Encounter (Signed)
LVM  With my call back number & main number. Advised I need to redraw for CBC

## 2019-05-14 ENCOUNTER — Ambulatory Visit: Payer: BLUE CROSS/BLUE SHIELD | Admitting: Family Medicine

## 2019-05-19 ENCOUNTER — Telehealth: Payer: Self-pay

## 2019-05-19 NOTE — Telephone Encounter (Signed)

## 2019-05-20 ENCOUNTER — Encounter: Payer: Self-pay | Admitting: Family Medicine

## 2019-05-20 ENCOUNTER — Ambulatory Visit (INDEPENDENT_AMBULATORY_CARE_PROVIDER_SITE_OTHER): Payer: BLUE CROSS/BLUE SHIELD | Admitting: Family Medicine

## 2019-05-20 VITALS — BP 120/80 | HR 77 | Ht 72.0 in | Wt 227.1 lb

## 2019-05-20 DIAGNOSIS — N183 Chronic kidney disease, stage 3 unspecified: Secondary | ICD-10-CM

## 2019-05-20 DIAGNOSIS — M10471 Other secondary gout, right ankle and foot: Secondary | ICD-10-CM | POA: Diagnosis not present

## 2019-05-20 DIAGNOSIS — N1831 Chronic kidney disease, stage 3a: Secondary | ICD-10-CM | POA: Insufficient documentation

## 2019-05-20 MED ORDER — COLCHICINE 0.6 MG PO CAPS: 1.0000 | ORAL_CAPSULE | Freq: Every day | ORAL | 1 refills | Status: DC

## 2019-05-20 MED ORDER — ALLOPURINOL 100 MG PO TABS: 100.0000 mg | ORAL_TABLET | Freq: Every day | ORAL | 1 refills | Status: DC

## 2019-05-20 NOTE — Patient Instructions (Signed)
Chronic Kidney Disease, Adult °Chronic kidney disease (CKD) happens when the kidneys are damaged over a long period of time. The kidneys are two organs that help with: °· Getting rid of waste and extra fluid from the blood. °· Making hormones that maintain the amount of fluid in your tissues and blood vessels. °· Making sure that the body has the right amount of fluids and chemicals. °Most of the time, CKD does not go away, but it can usually be controlled. Steps must be taken to slow down the kidney damage or to stop it from getting worse. If this is not done, the kidneys may stop working. °Follow these instructions at home: °Medicines °· Take over-the-counter and prescription medicines only as told by your doctor. You may need to change the amount of medicines you take. °· Do not take any new medicines unless your doctor says it is okay. Many medicines can make your kidney damage worse. °· Do not take any vitamin and supplements unless your doctor says it is okay. Many vitamins and supplements can make your kidney damage worse. °General instructions °· Follow a diet as told by your doctor. You may need to stay away from: °? Alcohol. °? Salty foods. °? Foods that are high in: °§ Potassium. °§ Calcium. °§ Protein. °· Do not use any products that contain nicotine or tobacco, such as cigarettes and e-cigarettes. If you need help quitting, ask your doctor. °· Keep track of your blood pressure at home. Tell your doctor about any changes. °· If you have diabetes, keep track of your blood sugar as told by your doctor. °· Try to stay at a healthy weight. If you need help, ask your doctor. °· Exercise at least 30 minutes a day, 5 days a week. °· Stay up-to-date with your shots (immunizations) as told by your doctor. °· Keep all follow-up visits as told by your doctor. This is important. °Contact a doctor if: °· Your symptoms get worse. °· You have new symptoms. °Get help right away if: °· You have symptoms of end-stage  kidney disease. These may include: °? Headaches. °? Numbness in your hands or feet. °? Easy bruising. °? Having hiccups often. °? Chest pain. °? Shortness of breath. °? Stopping of menstrual periods in women. °· You have a fever. °· You have very little pee (urine). °· You have pain or bleeding when you pee. °Summary °· Chronic kidney disease (CKD) happens when the kidneys are damaged over a long period of time. °· Most of the time, this condition does not go away, but it can usually be controlled. Steps must be taken to slow down the kidney damage or to stop it from getting worse. °· Treatment may include a combination of medicines and lifestyle changes. °This information is not intended to replace advice given to you by your health care provider. Make sure you discuss any questions you have with your health care provider. °Document Released: 01/29/2010 Document Revised: 10/17/2017 Document Reviewed: 12/09/2016 °Elsevier Patient Education © 2020 [employer] Inc. ° °

## 2019-05-20 NOTE — Progress Notes (Signed)
Established Patient Office Visit  Subjective:  Patient ID: James Sanders, male    DOB: 1955/11/26  Age: 63 y.o. MRN: [id number]  CC:  Chief Complaint  Patient presents with  . Follow-up    HPI James Falconrench Bedgood presents for follow-up of his acute gouty attacks.  Flares seem to respond to lower dose colchicine but he was only able to take it a few days secondary to cost.  Uric acid did come back remarkably elevated.  Blood pressures under better control today.  Past Medical History:  Diagnosis Date  . Gout     Past Surgical History:  Procedure Laterality Date  . IR FLUORO GUIDED NEEDLE PLC ASPIRATION/INJECTION LOC  04/02/2019    History reviewed. No pertinent family history.  Social History   Socioeconomic History  . Marital status: Single    Spouse name: Not on file  . Number of children: Not on file  . Years of education: Not on file  . Highest education level: Not on file  Occupational History  . Not on file  Social Needs  . Financial resource strain: Not on file  . Food insecurity    Worry: Not on file    Inability: Not on file  . Transportation needs    Medical: Not on file    Non-medical: Not on file  Tobacco Use  . Smoking status: Former Games developermoker  . Smokeless tobacco: Never Used  Substance and Sexual Activity  . Alcohol use: Yes    Comment: social  . Drug use: Yes    Types: Marijuana  . Sexual activity: Not on file  Lifestyle  . Physical activity    Days per week: Not on file    Minutes per session: Not on file  . Stress: Not on file  Relationships  . Social Musicianconnections    Talks on phone: Not on file    Gets together: Not on file    Attends religious service: Not on file    Active member of club or organization: Not on file    Attends meetings of clubs or organizations: Not on file    Relationship status: Not on file  . Intimate partner violence    Fear of current or ex partner: Not on file    Emotionally abused: Not on file    Physically  abused: Not on file    Forced sexual activity: Not on file  Other Topics Concern  . Not on file  Social History Narrative  . Not on file    Outpatient Medications Prior to Visit  Medication Sig Dispense Refill  . amiodarone (PACERONE) 200 MG tablet Take 200 mg by mouth daily.    Marland Kitchen. atorvastatin (LIPITOR) 40 MG tablet Take 40 mg by mouth at bedtime.    Marland Kitchen. lisinopril (ZESTRIL) 20 MG tablet Take 20 mg by mouth daily.    . metoprolol succinate (TOPROL-XL) 50 MG 24 hr tablet Take 50 mg by mouth every 12 (twelve) hours.    . torsemide (DEMADEX) 20 MG tablet Take 20 mg by mouth daily.    Carlena Hurl. XARELTO 20 MG TABS tablet Take 20 mg by mouth every evening.    . colchicine 0.6 MG tablet Take 1 tablet (0.6 mg total) by mouth daily. 30 tablet 1  . HYDROcodone-acetaminophen (NORCO) 5-325 MG tablet Take 1 tablet by mouth every 6 (six) hours as needed for moderate pain. 12 tablet 0  . lisinopril (ZESTRIL) 5 MG tablet Take 5 mg by mouth daily.  No facility-administered medications prior to visit.     No Known Allergies  ROS Review of Systems  Constitutional: Negative.   Respiratory: Negative.  Negative for chest tightness, shortness of breath and wheezing.   Cardiovascular: Negative.  Negative for chest pain and palpitations.  Gastrointestinal: Negative.   Genitourinary: Negative.   Musculoskeletal: Positive for arthralgias and joint swelling.  Psychiatric/Behavioral: Negative.       Objective:    Physical Exam  Constitutional: He is oriented to person, place, and time. He appears well-developed and well-nourished. No distress.  HENT:  Head: Normocephalic and atraumatic.  Right Ear: External ear normal.  Left Ear: External ear normal.  Eyes: Conjunctivae are normal. Right eye exhibits no discharge. Left eye exhibits no discharge. No scleral icterus.  Neck: No JVD present. No tracheal deviation present.  Cardiovascular: Normal rate, regular rhythm and normal heart sounds.  Pulmonary/Chest:  Effort normal. No stridor.  Musculoskeletal:     Right wrist: He exhibits decreased range of motion and swelling. He exhibits no tenderness.  Neurological: He is alert and oriented to person, place, and time.  Skin: Skin is warm and dry. He is not diaphoretic.  Psychiatric: He has a normal mood and affect. His behavior is normal.    BP 120/80   Pulse 77   Ht 6' (1.829 m)   Wt 227 lb 2 oz (103 kg)   SpO2 96%   BMI 30.80 kg/m  Wt Readings from Last 3 Encounters:  05/20/19 227 lb 2 oz (103 kg)  04/30/19 223 lb 2 oz (101.2 kg)   BP Readings from Last 3 Encounters:  05/20/19 120/80  04/30/19 130/80  04/02/19 (!) 155/89   Guideline developer:  UpToDate (see UpToDate for funding source) Date Released: June 2014  Health Maintenance Due  Topic Date Due  . Hepatitis C Screening  01/18/1958  . HIV Screening  05/09/1973  . TETANUS/TDAP  05/09/1977  . COLONOSCOPY  05/09/2008    There are no preventive care reminders to display for this patient.  No results found for: TSH Lab Results  Component Value Date   WBC CANCELED 04/30/2019   HGB 12.7 (L) 04/02/2019   HCT 38.8 (L) 04/02/2019   MCV 88.4 04/02/2019   PLT 179 04/02/2019   Lab Results  Component Value Date   NA 141 04/30/2019   K 4.0 04/30/2019   CO2 24 04/30/2019   GLUCOSE 97 04/30/2019   BUN 26 (H) 04/30/2019   CREATININE 2.19 (H) 04/30/2019   BILITOT 1.2 04/30/2019   AST 23 04/30/2019   ALT 21 04/30/2019   PROT 8.0 04/30/2019   CALCIUM 10.1 04/30/2019   ANIONGAP 9 04/02/2019   No results found for: CHOL No results found for: HDL No results found for: LDLCALC No results found for: TRIG No results found for: CHOLHDL No results found for: [id number]    Assessment & Plan:   Problem List Items Addressed This Visit      Genitourinary   Stage 3 chronic kidney disease (HCC)   Relevant Orders   Basic metabolic panel     Other   Gout - Primary   Relevant Medications   Colchicine (MITIGARE) 0.6 MG CAPS    allopurinol (ZYLOPRIM) 100 MG tablet      Meds ordered this encounter  Medications  . Colchicine (MITIGARE) 0.6 MG CAPS    Sig: Take 1 tablet by mouth daily.    Dispense:  30 capsule    Refill:  1  . allopurinol (ZYLOPRIM) 100  MG tablet    Sig: Take 1 tablet (100 mg total) by mouth daily.    Dispense:  30 tablet    Refill:  1    Follow-up: Return in about 1 month (around 06/20/2019).    Go ahead and start allopurinol at low dose.  Continue low-dose colchicine.  Hopefully will be able to discontinue the colchicine in a month.  Encourage patient to hydrate well.  Rechecking BMP today.

## 2019-05-21 LAB — BASIC METABOLIC PANEL
BUN/Creatinine Ratio: 11 (calc) (ref 6–22)
BUN: 16 mg/dL (ref 7–25)
CO2: 27 mmol/L (ref 20–32)
Calcium: 9 mg/dL (ref 8.6–10.3)
Chloride: 103 mmol/L (ref 98–110)
Creat: 1.46 mg/dL — ABNORMAL HIGH (ref 0.70–1.25)
Glucose, Bld: 95 mg/dL (ref 65–99)
Potassium: 3.7 mmol/L (ref 3.5–5.3)
Sodium: 139 mmol/L (ref 135–146)

## 2019-08-04 ENCOUNTER — Other Ambulatory Visit: Payer: Self-pay | Admitting: Family Medicine

## 2019-08-04 NOTE — Telephone Encounter (Signed)
torsemide (DEMADEX) 20 MG tablet   Send to [facility]/[address]

## 2019-08-04 NOTE — Telephone Encounter (Signed)
Requested medications are on the active medication list?  No- listed as historical on medication list  Future visit scheduled?  Yes- 08/06/2019  Notes to clinic-   Medication listed as historical on medication list  Requested Prescriptions  Pending Prescriptions Disp Refills   torsemide (DEMADEX) 20 MG tablet 30 tablet 11    Sig: Take 1 tablet (20 mg total) by mouth daily.     Cardiovascular:  Diuretics - Loop Failed - 08/04/2019  4:57 PM      Failed - Cr in normal range and within 360 days    Creat  Date Value Ref Range Status  05/20/2019 1.46 (H) 0.70 - 1.25 mg/dL Final    Comment:    For patients >19 years of age, the reference limit for Creatinine is approximately 13% higher for people identified as African-American. .          Passed - K in normal range and within 360 days    Potassium  Date Value Ref Range Status  05/20/2019 3.7 3.5 - 5.3 mmol/L Final         Passed - Ca in normal range and within 360 days    Calcium  Date Value Ref Range Status  05/20/2019 9.0 8.6 - 10.3 mg/dL Final         Passed - Na in normal range and within 360 days    Sodium  Date Value Ref Range Status  05/20/2019 139 135 - 146 mmol/L Final         Passed - Last BP in normal range    BP Readings from Last 1 Encounters:  05/20/19 120/80         Passed - Valid encounter within last 6 months    Recent Outpatient Visits          2 months ago Other secondary acute gout of right foot   [facility], Mortimer Fries, MD   3 months ago Other secondary acute gout of right foot   [facility], Mortimer Fries, MD      Future Appointments            In 2 days Ethelene Hal, Mortimer Fries, MD [facility]

## 2019-08-05 ENCOUNTER — Other Ambulatory Visit: Payer: Self-pay

## 2019-08-05 MED ORDER — TORSEMIDE 20 MG PO TABS: 20.0000 mg | ORAL_TABLET | Freq: Every day | ORAL | 2 refills | Status: DC

## 2019-08-06 ENCOUNTER — Ambulatory Visit (INDEPENDENT_AMBULATORY_CARE_PROVIDER_SITE_OTHER): Payer: BLUE CROSS/BLUE SHIELD | Admitting: Family Medicine

## 2019-08-06 ENCOUNTER — Encounter: Payer: Self-pay | Admitting: Family Medicine

## 2019-08-06 VITALS — BP 158/80 | HR 52 | Ht 72.0 in | Wt 231.0 lb

## 2019-08-06 DIAGNOSIS — I10 Essential (primary) hypertension: Secondary | ICD-10-CM | POA: Diagnosis not present

## 2019-08-06 DIAGNOSIS — B351 Tinea unguium: Secondary | ICD-10-CM

## 2019-08-06 DIAGNOSIS — N183 Chronic kidney disease, stage 3 unspecified: Secondary | ICD-10-CM

## 2019-08-06 DIAGNOSIS — E790 Hyperuricemia without signs of inflammatory arthritis and tophaceous disease: Secondary | ICD-10-CM | POA: Diagnosis not present

## 2019-08-06 DIAGNOSIS — Z23 Encounter for immunization: Secondary | ICD-10-CM | POA: Diagnosis not present

## 2019-08-06 DIAGNOSIS — M10471 Other secondary gout, right ankle and foot: Secondary | ICD-10-CM

## 2019-08-06 DIAGNOSIS — Z8739 Personal history of other diseases of the musculoskeletal system and connective tissue: Secondary | ICD-10-CM

## 2019-08-06 MED ORDER — ALLOPURINOL 100 MG PO TABS: 100.0000 mg | ORAL_TABLET | Freq: Every day | ORAL | 1 refills | Status: DC

## 2019-08-06 NOTE — Progress Notes (Signed)
Established Patient Office Visit  Subjective:  Patient ID: James Sanders, male    DOB: [birth year]  Age: 63 y.o. MRN: [id number]  CC:  Chief Complaint  Patient presents with  . Follow-up    HPI James Sanders presents for follow-up of his elevated uric acid and history of gout.  Patient has taken allopurinol for a month and then discontinued it because his toe stop hurting.  He did not understand any needed to take it continuously.  For the last week or so his right great toe has been bothering him a little.  He has not taken his Demadex in a few days and needs to go by the pharmacy to pick up his prescription.  No alcohol for 3 weeks he tells me.  Past Medical History:  Diagnosis Date  . Gout     Past Surgical History:  Procedure Laterality Date  . IR FLUORO GUIDED NEEDLE PLC ASPIRATION/INJECTION LOC  04/02/2019    History reviewed. No pertinent family history.  Social History   Socioeconomic History  . Marital status: Single    Spouse name: Not on file  . Number of children: Not on file  . Years of education: Not on file  . Highest education level: Not on file  Occupational History  . Not on file  Social Needs  . Financial resource strain: Not on file  . Food insecurity    Worry: Not on file    Inability: Not on file  . Transportation needs    Medical: Not on file    Non-medical: Not on file  Tobacco Use  . Smoking status: Former Research scientist (life sciences)  . Smokeless tobacco: Never Used  Substance and Sexual Activity  . Alcohol use: Yes    Comment: social  . Drug use: Yes    Types: Marijuana  . Sexual activity: Not on file  Lifestyle  . Physical activity    Days per week: Not on file    Minutes per session: Not on file  . Stress: Not on file  Relationships  . Social Herbalist on phone: Not on file    Gets together: Not on file    Attends religious service: Not on file    Active member of club or organization: Not on file    Attends meetings of clubs or  organizations: Not on file    Relationship status: Not on file  . Intimate partner violence    Fear of current or ex partner: Not on file    Emotionally abused: Not on file    Physically abused: Not on file    Forced sexual activity: Not on file  Other Topics Concern  . Not on file  Social History Narrative  . Not on file    Outpatient Medications Prior to Visit  Medication Sig Dispense Refill  . amiodarone (PACERONE) 200 MG tablet Take 200 mg by mouth daily.    Marland Kitchen atorvastatin (LIPITOR) 40 MG tablet Take 40 mg by mouth at bedtime.    . Colchicine (MITIGARE) 0.6 MG CAPS Take 1 tablet by mouth daily. 30 capsule 1  . lisinopril (ZESTRIL) 20 MG tablet Take 20 mg by mouth daily.    . metoprolol succinate (TOPROL-XL) 50 MG 24 hr tablet Take 50 mg by mouth every 12 (twelve) hours.    . torsemide (DEMADEX) 20 MG tablet Take 1 tablet (20 mg total) by mouth daily. 30 tablet 2  . XARELTO 20 MG TABS tablet Take 20 mg by mouth  every evening.    Marland Kitchen allopurinol (ZYLOPRIM) 100 MG tablet Take 1 tablet (100 mg total) by mouth daily. 30 tablet 1   No facility-administered medications prior to visit.     No Known Allergies  ROS Review of Systems  Constitutional: Negative for diaphoresis, fatigue, fever and unexpected weight change.  HENT: Negative.   Eyes: Negative for photophobia and visual disturbance.  Respiratory: Negative.   Cardiovascular: Negative.   Gastrointestinal: Negative.   Endocrine: Negative for polyphagia and polyuria.  Genitourinary: Negative.   Musculoskeletal: Positive for arthralgias. Negative for gait problem.  Neurological: Negative for seizures and speech difficulty.  Hematological: Negative.   Psychiatric/Behavioral: Negative.       Objective:    Physical Exam  Constitutional: He is oriented to person, place, and time. He appears well-developed and well-nourished. No distress.  HENT:  Head: Normocephalic and atraumatic.  Right Ear: External ear normal.  Left  Ear: External ear normal.  Eyes: Conjunctivae are normal. Right eye exhibits no discharge. Left eye exhibits no discharge. No scleral icterus.  Neck: No JVD present. No tracheal deviation present.  Pulmonary/Chest: Effort normal. No stridor.  Musculoskeletal:        General: No edema.       Feet:  Neurological: He is alert and oriented to person, place, and time.  Skin: Skin is warm and dry. He is not diaphoretic.  Psychiatric: He has a normal mood and affect. His behavior is normal.    BP (!) 158/80   Pulse (!) 52   Ht 6' (1.829 m)   Wt 231 lb (104.8 kg)   SpO2 98%   BMI 31.33 kg/m  Wt Readings from Last 3 Encounters:  08/06/19 231 lb (104.8 kg)  05/20/19 227 lb 2 oz (103 kg)  04/30/19 223 lb 2 oz (101.2 kg)   BP Readings from Last 3 Encounters:  08/06/19 (!) 158/80  05/20/19 120/80  04/30/19 130/80   Guideline developer:  UpToDate (see UpToDate for funding source) Date Released: June 2014  Health Maintenance Due  Topic Date Due  . Hepatitis C Screening  Oct 11, 1956  . HIV Screening  05/09/1973  . TETANUS/TDAP  05/09/1977  . COLONOSCOPY  05/09/2008  . INFLUENZA VACCINE  06/19/2019    There are no preventive care reminders to display for this patient.  No results found for: TSH Lab Results  Component Value Date   WBC CANCELED 04/30/2019   HGB 12.7 (L) 04/02/2019   HCT 38.8 (L) 04/02/2019   MCV 88.4 04/02/2019   PLT 179 04/02/2019   Lab Results  Component Value Date   NA 139 05/20/2019   K 3.7 05/20/2019   CO2 27 05/20/2019   GLUCOSE 95 05/20/2019   BUN 16 05/20/2019   CREATININE 1.46 (H) 05/20/2019   BILITOT 1.2 04/30/2019   AST 23 04/30/2019   ALT 21 04/30/2019   PROT 8.0 04/30/2019   CALCIUM 9.0 05/20/2019   ANIONGAP 9 04/02/2019   No results found for: CHOL No results found for: HDL No results found for: LDLCALC No results found for: TRIG No results found for: CHOLHDL No results found for: PQDI2M    Assessment & Plan:   Problem List  Items Addressed This Visit      Cardiovascular and Mediastinum   Essential hypertension - Primary     Musculoskeletal and Integument   Onychomycosis   Relevant Orders   Ambulatory referral to Podiatry     Genitourinary   Stage 3 chronic kidney disease (HCC)  Relevant Orders   Basic metabolic panel     Other   Elevated uric acid in blood   Relevant Medications   allopurinol (ZYLOPRIM) 100 MG tablet   Other Relevant Orders   Uric acid   History of gout   Relevant Orders   Uric acid    Other Visit Diagnoses    Other secondary acute gout of right foot       Relevant Medications   allopurinol (ZYLOPRIM) 100 MG tablet      Meds ordered this encounter  Medications  . allopurinol (ZYLOPRIM) 100 MG tablet    Sig: Take 1 tablet (100 mg total) by mouth daily.    Dispense:  30 tablet    Refill:  1    Follow-up: Return in about 2 months (around 10/06/2019).   Stressed the importance of taking allopurinol daily.  Follow-up in 2 months.  Cannot use the colchicine secondary to drug interactions.

## 2019-08-07 LAB — BASIC METABOLIC PANEL
BUN/Creatinine Ratio: 7 (calc) (ref 6–22)
BUN: 10 mg/dL (ref 7–25)
CO2: 28 mmol/L (ref 20–32)
Calcium: 9.3 mg/dL (ref 8.6–10.3)
Chloride: 107 mmol/L (ref 98–110)
Creat: 1.35 mg/dL — ABNORMAL HIGH (ref 0.70–1.25)
Glucose, Bld: 86 mg/dL (ref 65–99)
Potassium: 3.8 mmol/L (ref 3.5–5.3)
Sodium: 144 mmol/L (ref 135–146)

## 2019-08-07 LAB — URIC ACID: Uric Acid, Serum: 7.5 mg/dL (ref 4.0–8.0)

## 2019-08-09 NOTE — Addendum Note (Signed)
Addended by: Rodrigo Ran on: 08/09/2019 03:15 PM   Modules accepted: Orders

## 2019-10-13 ENCOUNTER — Other Ambulatory Visit: Payer: Self-pay | Admitting: Family Medicine

## 2019-10-13 MED ORDER — XARELTO 20 MG PO TABS: 20.0000 mg | ORAL_TABLET | Freq: Every evening | ORAL | 3 refills | Status: DC

## 2019-10-13 NOTE — Telephone Encounter (Signed)
Pt needs new rx from dr Ethelene Hal xarelto 20 mg 30 day supply w/refills. [facility] [address]. This med was last prescribed from old md. Pt has been out of med since friday

## 2019-10-13 NOTE — Telephone Encounter (Signed)
Rx sent 

## 2019-11-23 ENCOUNTER — Other Ambulatory Visit: Payer: Self-pay | Admitting: Family Medicine

## 2019-11-29 ENCOUNTER — Other Ambulatory Visit: Payer: Self-pay | Admitting: Family Medicine

## 2019-11-29 DIAGNOSIS — E790 Hyperuricemia without signs of inflammatory arthritis and tophaceous disease: Secondary | ICD-10-CM

## 2019-11-29 DIAGNOSIS — M10471 Other secondary gout, right ankle and foot: Secondary | ICD-10-CM

## 2019-11-29 NOTE — Telephone Encounter (Signed)
Pt has appointment scheduled 12/03/19

## 2019-12-02 ENCOUNTER — Other Ambulatory Visit: Payer: Self-pay

## 2019-12-03 ENCOUNTER — Emergency Department (HOSPITAL_COMMUNITY)
Admission: EM | Admit: 2019-12-03 | Discharge: 2019-12-03 | Disposition: A | Discharge status: Home / Self Care | Payer: BLUE CROSS/BLUE SHIELD | Attending: Emergency Medicine | Admitting: Emergency Medicine

## 2019-12-03 ENCOUNTER — Ambulatory Visit (INDEPENDENT_AMBULATORY_CARE_PROVIDER_SITE_OTHER): Payer: BLUE CROSS/BLUE SHIELD | Admitting: Family Medicine

## 2019-12-03 ENCOUNTER — Other Ambulatory Visit: Payer: Self-pay

## 2019-12-03 ENCOUNTER — Encounter: Payer: Self-pay | Admitting: Family Medicine

## 2019-12-03 VITALS — BP 94/68 | HR 132 | Temp 96.5°F | Wt 217.4 lb

## 2019-12-03 DIAGNOSIS — Z79899 Other long term (current) drug therapy: Secondary | ICD-10-CM | POA: Diagnosis not present

## 2019-12-03 DIAGNOSIS — I959 Hypotension, unspecified: Secondary | ICD-10-CM

## 2019-12-03 DIAGNOSIS — R001 Bradycardia, unspecified: Secondary | ICD-10-CM

## 2019-12-03 DIAGNOSIS — I129 Hypertensive chronic kidney disease with stage 1 through stage 4 chronic kidney disease, or unspecified chronic kidney disease: Secondary | ICD-10-CM | POA: Insufficient documentation

## 2019-12-03 DIAGNOSIS — I4891 Unspecified atrial fibrillation: Secondary | ICD-10-CM | POA: Insufficient documentation

## 2019-12-03 DIAGNOSIS — N1831 Chronic kidney disease, stage 3a: Secondary | ICD-10-CM | POA: Insufficient documentation

## 2019-12-03 DIAGNOSIS — R42 Dizziness and giddiness: Secondary | ICD-10-CM

## 2019-12-03 DIAGNOSIS — Z87891 Personal history of nicotine dependence: Secondary | ICD-10-CM | POA: Diagnosis not present

## 2019-12-03 DIAGNOSIS — I10 Essential (primary) hypertension: Secondary | ICD-10-CM

## 2019-12-03 DIAGNOSIS — R7989 Other specified abnormal findings of blood chemistry: Secondary | ICD-10-CM

## 2019-12-03 DIAGNOSIS — Z7901 Long term (current) use of anticoagulants: Secondary | ICD-10-CM | POA: Diagnosis not present

## 2019-12-03 DIAGNOSIS — R61 Generalized hyperhidrosis: Secondary | ICD-10-CM | POA: Insufficient documentation

## 2019-12-03 LAB — BASIC METABOLIC PANEL
Anion gap: 11 (ref 5–15)
BUN: 27 mg/dL — ABNORMAL HIGH (ref 8–23)
CO2: 26 mmol/L (ref 22–32)
Calcium: 9.3 mg/dL (ref 8.9–10.3)
Chloride: 104 mmol/L (ref 98–111)
Creatinine, Ser: 1.87 mg/dL — ABNORMAL HIGH (ref 0.61–1.24)
GFR calc Af Amer: 44 mL/min — ABNORMAL LOW (ref >60)
GFR calc non Af Amer: 38 mL/min — ABNORMAL LOW (ref >60)
Glucose, Bld: 105 mg/dL — ABNORMAL HIGH (ref 70–99)
Potassium: 3.8 mmol/L (ref 3.5–5.1)
Sodium: 141 mmol/L (ref 135–145)

## 2019-12-03 LAB — CBC WITH DIFFERENTIAL/PLATELET
Abs Immature Granulocytes: 0.02 10*3/uL (ref 0.00–0.07)
Basophils Absolute: 0.1 10*3/uL (ref 0.0–0.1)
Basophils Relative: 1 %
Eosinophils Absolute: 0.2 10*3/uL (ref 0.0–0.5)
Eosinophils Relative: 2 %
HCT: 41.2 % (ref 39.0–52.0)
Hemoglobin: 13.1 g/dL (ref 13.0–17.0)
Immature Granulocytes: 0 %
Lymphocytes Relative: 17 %
Lymphs Abs: 1.2 10*3/uL (ref 0.7–4.0)
MCH: 29.5 pg (ref 26.0–34.0)
MCHC: 31.8 g/dL (ref 30.0–36.0)
MCV: 92.8 fL (ref 80.0–100.0)
Monocytes Absolute: 0.7 10*3/uL (ref 0.1–1.0)
Monocytes Relative: 9 %
Neutro Abs: 5.2 10*3/uL (ref 1.7–7.7)
Neutrophils Relative %: 71 %
Platelets: 215 10*3/uL (ref 150–400)
RBC: 4.44 MIL/uL (ref 4.22–5.81)
RDW: 14.8 % (ref 11.5–15.5)
WBC: 7.3 10*3/uL (ref 4.0–10.5)
nRBC: 0 % (ref 0.0–0.2)

## 2019-12-03 MED ORDER — XARELTO 20 MG PO TABS: 20.0000 mg | ORAL_TABLET | Freq: Every evening | ORAL | 3 refills | Status: AC

## 2019-12-03 MED ORDER — ATORVASTATIN CALCIUM 40 MG PO TABS: 40.0000 mg | ORAL_TABLET | Freq: Every day | ORAL | 11 refills | Status: DC

## 2019-12-03 MED ORDER — XARELTO 20 MG PO TABS: 20.0000 mg | ORAL_TABLET | Freq: Every evening | ORAL | 3 refills | Status: DC

## 2019-12-03 NOTE — Patient Instructions (Signed)
You may contact [facility] either online at [email or web address] or call [phone] to schedule this.  You may also contact your local health departments and/or health departments in outlying counties :)

## 2019-12-03 NOTE — ED Provider Notes (Addendum)
[facility] EMERGENCY DEPARTMENT Provider Note   CSN: [id number] Arrival date & time: 12/03/19  1439     History No chief complaint on file.   James Sanders is a 64 y.o. male.  HPI     64 year old male comes in a chief complaint of dizziness and near fainting.  He has history of atrial fibrillation, CKD.  He was being seen at the outpatient clinic and suddenly started feeling hot and dizzy.  He started sweating as well and remained dizzy for about 30 seconds to a minute.  Patient's heart rate was in the 50s and he was advised to come to the ER.  Patient had no chest pain, vertigo, shortness of breath, palpitations.  He denies any similar episodes in the recent past and there has been no exertional chest pain or shortness of breath.  Patient states that he has been out of some medications as well.  Past Medical History:  Diagnosis Date  . Gout     Patient Active Problem List   Diagnosis Date Noted  . Atrial fibrillation (HCC) 12/03/2019  . Diaphoresis 12/03/2019  . Bradycardia 12/03/2019  . Hypotensive episode 12/03/2019  . History of gout 08/06/2019  . Essential hypertension 08/06/2019  . Onychomycosis 08/06/2019  . Stage 3a chronic kidney disease 05/20/2019  . Elevated uric acid in blood 04/30/2019    Past Surgical History:  Procedure Laterality Date  . IR FLUORO GUIDED NEEDLE PLC ASPIRATION/INJECTION LOC  04/02/2019       No family history on file.  Social History   Tobacco Use  . Smoking status: Former Games developer  . Smokeless tobacco: Never Used  Substance Use Topics  . Alcohol use: Yes    Comment: social  . Drug use: Yes    Types: Marijuana    Home Medications Prior to Admission medications   Medication Sig Start Date End Date Taking? Authorizing Provider  allopurinol (ZYLOPRIM) 100 MG tablet TAKE 1 TABLET(100 MG) BY MOUTH DAILY 11/29/19   Mliss Sax, MD  amiodarone (PACERONE) 200 MG tablet Take 200 mg by mouth daily.  01/29/19 01/29/20  [provider]  atorvastatin (LIPITOR) 40 MG tablet Take 40 mg by mouth at bedtime. 01/28/19 01/28/20  [provider]  lisinopril (ZESTRIL) 20 MG tablet Take 20 mg by mouth daily. 05/06/19   [provider]  metoprolol succinate (TOPROL-XL) 50 MG 24 hr tablet Take 50 mg by mouth every 12 (twelve) hours. 01/28/19   [provider]  torsemide (DEMADEX) 20 MG tablet TAKE 1 TABLET(20 MG) BY MOUTH DAILY 11/23/19   Mliss Sax, MD  XARELTO 20 MG TABS tablet Take 1 tablet (20 mg total) by mouth every evening. 10/13/19   Mliss Sax, MD    Allergies    Patient has no known allergies.  Review of Systems   Review of Systems  Constitutional: Positive for activity change.  Respiratory: Negative for shortness of breath.   Cardiovascular: Negative for chest pain.  Gastrointestinal: Negative for nausea and vomiting.  Allergic/Immunologic: Negative for immunocompromised state.  Neurological: Positive for light-headedness.  Hematological: Bruises/bleeds easily.  All other systems reviewed and are negative.   Physical Exam Updated Vital Signs BP (!) 126/91   Pulse (!) 59   Temp 98.5 F (36.9 C) (Oral)   Resp 14   SpO2 97%   Physical Exam Vitals and nursing note reviewed.  Constitutional:      Appearance: He is well-developed.  HENT:     Head: Atraumatic.  Cardiovascular:     Rate and Rhythm: Bradycardia present.  Pulmonary:     Effort: Pulmonary effort is normal.     Breath sounds: Wheezes:   Musculoskeletal:     Cervical back: Neck supple.  Skin:    General: Skin is warm.  Neurological:     Mental Status: He is alert and oriented to person, place, and time.     ED Results / Procedures / Treatments   Labs (all labs ordered are listed, but only abnormal results are displayed) Labs Reviewed  BASIC METABOLIC PANEL  CBC WITH DIFFERENTIAL/PLATELET    EKG EKG Interpretation  Date/Time:  Friday December 03 2019 14:44:40 EST Ventricular Rate:  55 PR Interval:    QRS Duration: 119 QT Interval:  466 QTC Calculation: 446 R Axis:   63 Text Interpretation: Sinus rhythm Probable left atrial enlargement Nonspecific intraventricular conduction delay Abnormal T, consider ischemia, lateral leads No acute changes TWI in the inferior and lateral leads Confirmed by Varney Biles [phone]) on 12/03/2019 2:52:26 PM   Radiology No results found.  Procedures Procedures (including critical care time)  Medications Ordered in ED Medications - No data to display  ED Course  I have reviewed the triage vital signs and the nursing notes.  Pertinent labs & imaging results that were available during my care of the patient were reviewed by me and considered in my medical decision making (see chart for details).    MDM Rules/Calculators/A&P                       DDx includes: Orthostatic hypotension Stroke Vertebral artery dissection/stenosis Dysrhythmia PE Vasovagal/neurocardiogenic syncope Aortic stenosis Valvular disorder/Cardiomyopathy Anemia  64 year old with history of CKD, A. fib comes in a chief complaint of near syncope.  He started having dizziness, diaphoresis and lightheadedness while in the waiting room at a clinic.  He was noted to be bradycardic and sent to the ER for further evaluation.  Patient is in sinus bradycardia.  His medications include amiodarone and metoprolol, he is not taking the latter at this time.  Patient currently does not have any symptoms of dizziness or near syncope.  He had no cardiac prodrome prior to his episode and it appears that it likely was a vasovagal /neurocardiogenic episode.  However his heart rate being in the 50s is still alarming and we will consult cardiology to see if there is any adjustment needed on amiodarone.  Patient will be given follow-up with cardiology as he has none in the [place] area. We will restart his Xarelto.  3:57 PM Patient's  records from [place] reviewed utilizing care everywhere.  It appears that patient has had bradycardia in each of his EKGs in April, June, and March 2020.  The cardiology visit from June also reveals that patient was bradycardic and they were to reduce his metoprolol. His QT looks slightly prolonged to [place].  We will try to establish him with outpatient cardiology.  This time it does not appear that patient had a primary cardiac event necessitating admission.  I was unable to review the records of his Holter monitor on care everywhere, but patient reports that his Holter monitor and his stress test last year were negative for any acute findings.`  Final Clinical Impression(s) / ED Diagnoses Final diagnoses:  Sinus bradycardia  Dizziness    Rx / DC Orders ED Discharge Orders    None       Varney Biles, MD 12/03/19 1552  Derwood Kaplan, MD 12/03/19 1558

## 2019-12-03 NOTE — Progress Notes (Addendum)
Established Patient Office Visit  Subjective:  Patient ID: James Sanders, male    DOB: [birth year]  Age: 64 y.o. MRN: [id number]  CC:  Chief Complaint  Patient presents with  . Follow-up    Patient is here today for a F/U for gouty arth, HTN and CKD-stage 3. At last visit his BP was 158/80 on 9.18.20.  He was advised to take Allopurinol 1qd and to F/U in 2 months. He states that he has not had his Xarelto due to cost in a month and needs this.  He agrees to his Tdap. He would like to get his Hep-C lab draw. He agrees to referral for GI for Colonoscopy.01    HPI James Sanders presents for follow-up of his gout.  He has been lost to follow-up for the last couple months.  Currently followed at [facility] cardiology for hypertension, atrial fib and elevated cholesterol.  It seems as though he has been lost to follow-up there as well.  Currently living in [place].  He had been seen at [facility] cardiology because he was living in [place] at the time.  Recently restarted his amiodarone, lisinopril, metoprolol and furosemide yesterday.  He has been out of his allopurinol.  Past Medical History:  Diagnosis Date  . Gout     Past Surgical History:  Procedure Laterality Date  . IR FLUORO GUIDED NEEDLE PLC ASPIRATION/INJECTION LOC  04/02/2019    History reviewed. No pertinent family history.  Social History   Socioeconomic History  . Marital status: Single    Spouse name: Not on file  . Number of children: Not on file  . Years of education: Not on file  . Highest education level: Not on file  Occupational History  . Not on file  Tobacco Use  . Smoking status: Former Games developer  . Smokeless tobacco: Never Used  Substance and Sexual Activity  . Alcohol use: Yes    Comment: social  . Drug use: Yes    Types: Marijuana  . Sexual activity: Not on file  Other Topics Concern  . Not on file  Social History Narrative  . Not on file   Social Determinants of Health   Financial Resource Strain:     . Difficulty of Paying Living Expenses: Not on file  Food Insecurity:   . Worried About Programme researcher, broadcasting/film/video in the Last Year: Not on file  . Ran Out of Food in the Last Year: Not on file  Transportation Needs:   . Lack of Transportation (Medical): Not on file  . Lack of Transportation (Non-Medical): Not on file  Physical Activity:   . Days of Exercise per Week: Not on file  . Minutes of Exercise per Session: Not on file  Stress:   . Feeling of Stress : Not on file  Social Connections:   . Frequency of Communication with Friends and Family: Not on file  . Frequency of Social Gatherings with Friends and Family: Not on file  . Attends Religious Services: Not on file  . Active Member of Clubs or Organizations: Not on file  . Attends Banker Meetings: Not on file  . Marital Status: Not on file  Intimate Partner Violence:   . Fear of Current or Ex-Partner: Not on file  . Emotionally Abused: Not on file  . Physically Abused: Not on file  . Sexually Abused: Not on file    Outpatient Medications Prior to Visit  Medication Sig Dispense Refill  . allopurinol (ZYLOPRIM) 100 MG  tablet TAKE 1 TABLET(100 MG) BY MOUTH DAILY 30 tablet 1  . lisinopril (ZESTRIL) 20 MG tablet Take 20 mg by mouth daily.    Marland Kitchen torsemide (DEMADEX) 20 MG tablet TAKE 1 TABLET(20 MG) BY MOUTH DAILY 30 tablet 0  . amiodarone (PACERONE) 200 MG tablet Take 200 mg by mouth daily.    Marland Kitchen atorvastatin (LIPITOR) 40 MG tablet Take 40 mg by mouth at bedtime.    . metoprolol succinate (TOPROL-XL) 50 MG 24 hr tablet Take 50 mg by mouth every 12 (twelve) hours.    Carlena Hurl 20 MG TABS tablet Take 1 tablet (20 mg total) by mouth every evening. 30 tablet 3   No facility-administered medications prior to visit.    No Known Allergies  ROS Review of Systems  Constitutional: Negative for diaphoresis, fatigue, fever and unexpected weight change.  HENT: Negative.   Eyes: Negative for photophobia and visual disturbance.   Respiratory: Negative for chest tightness and shortness of breath.   Cardiovascular: Negative for chest pain and palpitations.  Gastrointestinal: Negative for abdominal pain, nausea and vomiting.  Genitourinary: Negative.   Musculoskeletal: Positive for arthralgias.  Psychiatric/Behavioral: Negative.       Objective:    Physical Exam  Constitutional: He is oriented to person, place, and time. He appears well-developed and well-nourished.  Non-toxic appearance. He does not have a sickly appearance.  HENT:  Head: Normocephalic and atraumatic.  Right Ear: External ear normal.  Left Ear: External ear normal.  Eyes: Pupils are equal, round, and reactive to light. Conjunctivae are normal. Right eye exhibits no discharge. Left eye exhibits no discharge. No scleral icterus.  Neck: No JVD present. No tracheal deviation present.  Cardiovascular: Bradycardia present.  Pulmonary/Chest: Effort normal and breath sounds normal. No stridor.  Abdominal: Bowel sounds are normal.  Lymphadenopathy:    He has no cervical adenopathy.  Neurological: He is alert and oriented to person, place, and time.  Skin: He is diaphoretic.  Psychiatric: He has a normal mood and affect. His behavior is normal.    BP 94/68 (BP Location: Left Arm, Patient Position: Sitting, Cuff Size: Normal)   Pulse (!) 132   Temp (!) 96.5 F (35.8 C) (Temporal)   Wt 217 lb 6.4 oz (98.6 kg)   SpO2 97%   BMI 29.48 kg/m  Wt Readings from Last 3 Encounters:  12/10/19 219 lb (99.3 kg)  12/03/19 217 lb 6.4 oz (98.6 kg)  08/06/19 231 lb (104.8 kg)     Health Maintenance Due  Topic Date Due  . Hepatitis C Screening  Jul 01, 1956  . HIV Screening  05/09/1973  . TETANUS/TDAP  05/09/1977  . COLONOSCOPY  05/09/2008    There are no preventive care reminders to display for this patient.  Lab Results  Component Value Date   TSH 0.388 (L) 12/10/2019   Lab Results  Component Value Date   WBC 7.3 12/03/2019   HGB 13.1  12/03/2019   HCT 41.2 12/03/2019   MCV 92.8 12/03/2019   PLT 215 12/03/2019   Lab Results  Component Value Date   NA 141 12/03/2019   K 3.8 12/03/2019   CO2 26 12/03/2019   GLUCOSE 105 (H) 12/03/2019   BUN 27 (H) 12/03/2019   CREATININE 1.87 (H) 12/03/2019   BILITOT 0.4 12/10/2019   ALKPHOS 115 12/10/2019   AST 25 12/10/2019   ALT 27 12/10/2019   PROT 7.7 12/10/2019   ALBUMIN 4.5 12/10/2019   CALCIUM 9.3 12/03/2019   ANIONGAP 11 12/03/2019  No results found for: CHOL No results found for: HDL No results found for: LDLCALC No results found for: TRIG No results found for: CHOLHDL No results found for: HGBA1C    Assessment & Plan:   Problem List Items Addressed This Visit      Cardiovascular and Mediastinum   Essential hypertension - Primary   Relevant Orders   Ambulatory referral to Cardiology   Atrial fibrillation [facility])   Relevant Orders   Ambulatory referral to Cardiology   Hypotensive episode   Relevant Orders   Ambulatory referral to Cardiology     Genitourinary   Stage 3a chronic kidney disease     Other   Diaphoresis   Bradycardia   Relevant Orders   Ambulatory referral to Cardiology   Abnormal TSH   Relevant Orders   TSH   T3, free      No orders of the defined types were placed in this encounter.   Follow-up: No follow-ups on file.   Just as the CMA was finishing up charting for me to see him patient became lightheaded and diaphoretic.  He was placed in the supine position on the exam table and I entered the room.  He was responsive and breathing.  Pulse rate was measured at 50, satting at 99% blood pressure was measured at 94/68.  EMS was summoned.  Believe that it is more appropriate for him to be followed by cardiology here in [place].  EMS arrived and he was transported to the hospital.   We will follow-up with cardiology and then back to see me for his gout issues.  Libby Maw, MD

## 2019-12-03 NOTE — ED Triage Notes (Signed)
Pt was at [facility] PCP for check up when he had sudden onset of diaphoresis and lightheadedness while sitting in the chair. Pt bradycardic at that time to 51, bp 96/50. Pt having difficulty getting his medications d/t insurance problems. Has not had xarelto in one month.

## 2019-12-03 NOTE — Discharge Instructions (Signed)
Please follow up with Afib clinic for optimal care.  Return to the ER if you have repeat episode of fainting.

## 2019-12-07 ENCOUNTER — Ambulatory Visit: Payer: BLUE CROSS/BLUE SHIELD | Admitting: Cardiology

## 2019-12-08 ENCOUNTER — Ambulatory Visit: Payer: BLUE CROSS/BLUE SHIELD | Admitting: Cardiology

## 2019-12-10 ENCOUNTER — Other Ambulatory Visit: Payer: Self-pay

## 2019-12-10 ENCOUNTER — Encounter: Payer: Self-pay | Admitting: Cardiology

## 2019-12-10 ENCOUNTER — Ambulatory Visit (HOSPITAL_BASED_OUTPATIENT_CLINIC_OR_DEPARTMENT_OTHER)
Admission: RE | Admit: 2019-12-10 | Discharge: 2019-12-10 | Disposition: A | Discharge status: Home / Self Care | Payer: BLUE CROSS/BLUE SHIELD | Source: Ambulatory Visit | Attending: Cardiology | Admitting: Cardiology

## 2019-12-10 ENCOUNTER — Ambulatory Visit (INDEPENDENT_AMBULATORY_CARE_PROVIDER_SITE_OTHER): Payer: BLUE CROSS/BLUE SHIELD | Admitting: Cardiology

## 2019-12-10 VITALS — BP 130/62 | HR 71 | Ht 72.0 in | Wt 219.0 lb

## 2019-12-10 DIAGNOSIS — I480 Paroxysmal atrial fibrillation: Secondary | ICD-10-CM | POA: Diagnosis not present

## 2019-12-10 DIAGNOSIS — R001 Bradycardia, unspecified: Secondary | ICD-10-CM | POA: Diagnosis not present

## 2019-12-10 DIAGNOSIS — I10 Essential (primary) hypertension: Secondary | ICD-10-CM

## 2019-12-10 DIAGNOSIS — Z9229 Personal history of other drug therapy: Secondary | ICD-10-CM | POA: Diagnosis not present

## 2019-12-10 MED ORDER — AMIODARONE HCL 100 MG PO TABS: 100.0000 mg | ORAL_TABLET | Freq: Every day | ORAL | 4 refills | Status: DC

## 2019-12-10 MED ORDER — METOPROLOL SUCCINATE ER 25 MG PO TB24: 25.0000 mg | ORAL_TABLET | Freq: Two times a day (BID) | ORAL | 3 refills | Status: DC

## 2019-12-10 MED ORDER — METOPROLOL SUCCINATE ER 25 MG PO TB24: 25.0000 mg | ORAL_TABLET | Freq: Every day | ORAL | 3 refills | Status: DC

## 2019-12-10 NOTE — Progress Notes (Signed)
Cardiology Office Note:    Date:  12/10/2019   ID:  James Sanders, DOB 08-Jan-1956, MRN [id number]  PCP:  Mliss Sax, MD  Cardiologist:  Garwin Brothers, MD   Referring MD: Mliss Sax,*    ASSESSMENT:    1. Paroxysmal atrial fibrillation (HCC)   2. Essential hypertension   3. Bradycardia    PLAN:    In order of problems listed above:  1. Primary prevention stressed with the patient.  Importance of compliance with diet and medication stressed and he vocalized understanding. 2. Paroxysmal atrial fibrillation:I discussed with the patient atrial fibrillation, disease process. Management and therapy including rate and rhythm control, anticoagulation benefits and potential risks were discussed extensively with the patient. Patient had multiple questions which were answered to patient's satisfaction.  Patient is on amiodarone therapy and I am not sure whether this is a great idea in a young patient like this to keep him on amiodarone for an extended period of time.  At the present time I would get baseline LFTs, chest x-ray and pulmonary function tests to make sure that that is tolerating the medication well.  I will get an echocardiogram to assess left ventricular systolic function which was abnormal in the past.  [facility] 3. Essential hypertension: Blood pressure is stable.  In view of the fact that his blood pressure and heart rate are borderline I have cut down his amiodarone and metoprolol dose into half.  Importance of regular exercise stressed and he promises to do so.  He has abdominal obesity and risks were explained and he promises to do better.  Follow-up appointment in a month or earlier if he has any concerns.  Multiple questions which were answered to his satisfaction.  Total time for this evaluation was 45 minutes.   Medication Adjustments/Labs and Tests Ordered: Current medicines are reviewed at length with the patient  today.  Concerns regarding medicines are outlined above.  No orders of the defined types were placed in this encounter.  No orders of the defined types were placed in this encounter.    History of Present Illness:    James Sanders is a 64 y.o. male who is being seen today for the evaluation of paroxysmal atrial fibrillation at the request of Mliss Sax,*.  Patient has past medical history of essential hypertension, paroxysmal atrial fibrillation, renal insufficiency.  He is evaluation at [facility] reveals ejection fraction to be mildly depressed.  I do not have the complete details.  Patient went to see his primary care physician.  He felt a little lightheaded.  He was sent to the emergency room.  His emergency room evaluation was unremarkable.  He was discharged home.  Subsequently is done fine.  No chest pain orthopnea or PND.  At the time of my evaluation, the patient is alert awake oriented and in no distress.  Past Medical History:  Diagnosis Date  . Gout     Past Surgical History:  Procedure Laterality Date  . IR FLUORO GUIDED NEEDLE PLC ASPIRATION/INJECTION LOC  04/02/2019    Current Medications: Current Meds  Medication Sig  . allopurinol (ZYLOPRIM) 100 MG tablet TAKE 1 TABLET(100 MG) BY MOUTH DAILY  . amiodarone (PACERONE) 200 MG tablet Take 200 mg by mouth daily.  Marland Kitchen atorvastatin (LIPITOR) 40 MG tablet Take 1 tablet (40 mg total) by mouth at bedtime.  Marland Kitchen lisinopril (ZESTRIL) 20 MG tablet Take 20 mg by mouth daily.  Marland Kitchen torsemide (DEMADEX)  20 MG tablet TAKE 1 TABLET(20 MG) BY MOUTH DAILY  . XARELTO 20 MG TABS tablet Take 1 tablet (20 mg total) by mouth every evening.     Allergies:   Patient has no known allergies.   Social History   Socioeconomic History  . Marital status: Single    Spouse name: Not on file  . Number of children: Not on file  . Years of education: Not on file  . Highest education level: Not on file  Occupational History  . Not on  file  Tobacco Use  . Smoking status: Former Research scientist (life sciences)  . Smokeless tobacco: Never Used  Substance and Sexual Activity  . Alcohol use: Yes    Comment: social  . Drug use: Yes    Types: Marijuana  . Sexual activity: Not on file  Other Topics Concern  . Not on file  Social History Narrative  . Not on file   Social Determinants of Health   Financial Resource Strain:   . Difficulty of Paying Living Expenses: Not on file  Food Insecurity:   . Worried About Charity fundraiser in the Last Year: Not on file  . Ran Out of Food in the Last Year: Not on file  Transportation Needs:   . Lack of Transportation (Medical): Not on file  . Lack of Transportation (Non-Medical): Not on file  Physical Activity:   . Days of Exercise per Week: Not on file  . Minutes of Exercise per Session: Not on file  Stress:   . Feeling of Stress : Not on file  Social Connections:   . Frequency of Communication with Friends and Family: Not on file  . Frequency of Social Gatherings with Friends and Family: Not on file  . Attends Religious Services: Not on file  . Active Member of Clubs or Organizations: Not on file  . Attends Archivist Meetings: Not on file  . Marital Status: Not on file     Family History: The patient's family history is not on file.  ROS:   Please see the history of present illness.    All other systems reviewed and are negative.  EKGs/Labs/Other Studies Reviewed:    The following studies were reviewed today: EKG reveals sinus rhythm and nonspecific ST-T changes.   Recent Labs: 04/30/2019: ALT 21 12/03/2019: BUN 27; Creatinine, Ser 1.87; Hemoglobin 13.1; Platelets 215; Potassium 3.8; Sodium 141  Recent Lipid Panel No results found for: CHOL, TRIG, HDL, CHOLHDL, VLDL, LDLCALC, LDLDIRECT  Physical Exam:    VS:  BP 130/62   Pulse 71   Ht 6' (1.829 m)   Wt 219 lb (99.3 kg)   SpO2 (!) 84%   BMI 29.70 kg/m     Wt Readings from Last 3 Encounters:  12/10/19 219 lb  (99.3 kg)  12/03/19 217 lb 6.4 oz (98.6 kg)  08/06/19 231 lb (104.8 kg)     GEN: Patient is in no acute distress HEENT: Normal NECK: No JVD; No carotid bruits LYMPHATICS: No lymphadenopathy CARDIAC: S1 S2 regular, 2/6 systolic murmur at the apex. RESPIRATORY:  Clear to auscultation without rales, wheezing or rhonchi  ABDOMEN: Soft, non-tender, non-distended MUSCULOSKELETAL:  No edema; No deformity  SKIN: Warm and dry NEUROLOGIC:  Alert and oriented x 3 PSYCHIATRIC:  Normal affect    Signed, Jenean Lindau, MD  12/10/2019 1:57 PM     [facility]

## 2019-12-10 NOTE — Patient Instructions (Signed)
Medication Instructions:  Your physician has recommended you make the following change in your medication:   DECREASE amiodarone to 100 mg (1 tablet) once daily  DECREASE Toprol to 25 mg (0.5 tablet) once daily   *If you need a refill on your cardiac medications before your next appointment, please call your pharmacy*  Lab Work: Your physician recommends that you have a TSH and hepatic drawn  If you have labs (blood work) drawn today and your tests are completely normal, you will receive your results only by: Marland Kitchen MyChart Message (if you have MyChart) OR . A paper copy in the mail If you have any lab test that is abnormal or we need to change your treatment, we will call you to review the results.  Testing/Procedures: A chest x-ray takes a picture of the organs and structures inside the chest, including the heart, lungs, and blood vessels. This test can show several things, including, whether the heart is enlarges; whether fluid is building up in the lungs; and whether pacemaker / defibrillator leads are still in place. Your physician has recommended that you have a pulmonary function test. Pulmonary Function Tests are a group of tests that measure how well air moves in and out of your lungs.    Follow-Up: At [facility], you and your health needs are our priority.  As part of our continuing mission to provide you with exceptional heart care, we have created designated Provider Care Teams.  These Care Teams include your primary Cardiologist (physician) and Advanced Practice Providers (APPs -  Physician Assistants and Nurse Practitioners) who all work together to provide you with the care you need, when you need it.  Your next appointment:   1 month(s)  The format for your next appointment:   In Person  Provider:   Belva Crome, MD  Other Instructions  Pulmonary Function Tests Pulmonary function tests (PFTs) are used to measure how well your lungs work, find out what is  causing your lung problems, and figure out the best treatment for you. You may have PFTs:  When you have an illness involving the lungs.  To follow changes in your lung function over time if you have a chronic lung disease.  If you are an IT trainer. This checks the effects of being exposed to chemicals over a long period of time.  To check lung function before having surgery or other procedures.  To check your lungs if you smoke.  To check if prescribed medicines or treatments are helping your lungs. Your results will be compared to the expected lung function of someone with healthy lungs who is similar to you in:  Age.  Gender.  Height.  Weight.  Race or ethnicity. This is done to show how your lungs compare to normal lung function (percent predicted). This is how your health care provider knows if your lung function is normal or not. If you have had PFTs done before, your health care provider will compare your current results with past results. This shows if your lung function is better, worse, or the same as before. Tell a health care provider about:  Any allergies you have.  All medicines you are taking, including inhaler or nebulizer medicines, vitamins, herbs, eye drops, creams, and over-the-counter medicines.  Any blood disorders you have.  Any surgeries you have had, especially recent eye surgery, abdominal surgery, or chest surgery. These can make PFTs difficult or unsafe.  Any medical conditions you have, including chest pain or heart  problems, tuberculosis, or respiratory infections such as pneumonia, a cold, or the flu.  Any fear of being in closed spaces (claustrophobia). Some of your tests may be in a closed space. What are the risks? Generally, this is a safe procedure. However, problems may occur, including:  Light-headedness due to over-breathing (hyperventilation).  An asthma attack from deep breathing.  A collapsed lung. What happens  before the procedure?  Take over-the-counter and prescription medicines only as told by your health care provider. If you take inhaler or nebulizer medicines, ask your health care provider which medicines you should take on the day of your testing. Some inhaler medicines may interfere with PFTs if they are taken shortly before the tests.  Follow your health care provider's instructions on eating and drinking restrictions. This may include avoiding eating large meals and drinking alcohol before the testing.  Do not use any products that contain nicotine or tobacco, such as cigarettes and e-cigarettes. If you need help quitting, ask your health care provider.  Wear comfortable clothing that will not interfere with breathing. What happens during the procedure?   You will be given a soft nose clip to wear. This is done so all of your breaths will go through your mouth instead of your nose.  You will be given a germ-free (sterile) mouthpiece. It will be attached to a machine that measures your breathing (spirometer).  You will be asked to do various breathing maneuvers. The maneuvers will be done by breathing in (inhaling) and breathing out (exhaling). You may be asked to repeat the maneuvers several times before the testing is done.  It is important to follow the instructions exactly to get accurate results. Make sure to blow as hard and as fast as you can when you are told to do so.  You may be given a medicine that makes the small air passages in your lungs larger (bronchodilator) after testing has been done. This medicine will make it easier for you to breathe.  The tests will be repeated after the bronchodilator has taken effect.  You will be monitored carefully during the procedure for faintness, dizziness, trouble breathing, or any other problems. The procedure may vary among health care providers and hospitals. What happens after the procedure?  It is up to you to get your test  results. Ask your health care provider, or the department that is doing the tests, when your results will be ready. After you have received your test results, talk with your health care provider about treatment options, if necessary. Summary  Pulmonary function tests (PFTs) are used to measure how well your lungs work, find out what is causing your lung problems, and figure out the best treatment for you.  Wear comfortable clothing that will not interfere with breathing.  It is up to you to get your test results. After you have received them, talk with your health care provider about treatment options, if necessary. This information is not intended to replace advice given to you by your health care provider. Make sure you discuss any questions you have with your health care provider. Document Revised: 11/01/2016 Document Reviewed: 09/26/2016 Elsevier Patient Education  2020 [employer].

## 2019-12-11 LAB — HEPATIC FUNCTION PANEL
ALT: 27 IU/L (ref 0–44)
AST: 25 IU/L (ref 0–40)
Albumin: 4.5 g/dL (ref 3.8–4.8)
Alkaline Phosphatase: 115 IU/L (ref 39–117)
Bilirubin Total: 0.4 mg/dL (ref 0.0–1.2)
Bilirubin, Direct: 0.13 mg/dL (ref 0.00–0.40)
Total Protein: 7.7 g/dL (ref 6.0–8.5)

## 2019-12-11 LAB — TSH: TSH: 0.388 u[IU]/mL — ABNORMAL LOW (ref 0.450–4.500)

## 2019-12-24 DIAGNOSIS — R7989 Other specified abnormal findings of blood chemistry: Secondary | ICD-10-CM | POA: Insufficient documentation

## 2019-12-28 ENCOUNTER — Other Ambulatory Visit: Payer: Self-pay

## 2019-12-28 NOTE — Addendum Note (Signed)
Addended by: Andrez Grime on: 12/28/2019 03:14 PM   Modules accepted: Orders

## 2019-12-29 ENCOUNTER — Other Ambulatory Visit (INDEPENDENT_AMBULATORY_CARE_PROVIDER_SITE_OTHER): Payer: BLUE CROSS/BLUE SHIELD

## 2019-12-29 DIAGNOSIS — R7989 Other specified abnormal findings of blood chemistry: Secondary | ICD-10-CM | POA: Diagnosis not present

## 2019-12-29 LAB — T3, FREE: T3, Free: 3.3 pg/mL (ref 2.3–4.2)

## 2019-12-29 LAB — TSH: TSH: 1.43 u[IU]/mL (ref 0.35–4.50)

## 2020-01-05 ENCOUNTER — Ambulatory Visit: Payer: BLUE CROSS/BLUE SHIELD | Admitting: Cardiology

## 2020-01-18 ENCOUNTER — Ambulatory Visit (INDEPENDENT_AMBULATORY_CARE_PROVIDER_SITE_OTHER): Payer: BLUE CROSS/BLUE SHIELD | Admitting: Cardiology

## 2020-01-18 ENCOUNTER — Other Ambulatory Visit: Payer: Self-pay

## 2020-01-18 ENCOUNTER — Encounter: Payer: Self-pay | Admitting: Cardiology

## 2020-01-18 VITALS — BP 128/88 | HR 58 | Temp 98.1°F | Resp 20 | Ht 72.0 in | Wt 228.5 lb

## 2020-01-18 DIAGNOSIS — I10 Essential (primary) hypertension: Secondary | ICD-10-CM

## 2020-01-18 DIAGNOSIS — I4891 Unspecified atrial fibrillation: Secondary | ICD-10-CM

## 2020-01-18 NOTE — Patient Instructions (Signed)
Medication Instructions:  No medication changes *If you need a refill on your cardiac medications before your next appointment, please call your pharmacy*   Lab Work: None ordered If you have labs (blood work) drawn today and your tests are completely normal, you will receive your results only by: Marland Kitchen MyChart Message (if you have MyChart) OR . A paper copy in the mail If you have any lab test that is abnormal or we need to change your treatment, we will call you to review the results.   Testing/Procedures: None ordered   Follow-Up: At [facility], you and your health needs are our priority.  As part of our continuing mission to provide you with exceptional heart care, we have created designated Provider Care Teams.  These Care Teams include your primary Cardiologist (physician) and Advanced Practice Providers (APPs -  Physician Assistants and Nurse Practitioners) who all work together to provide you with the care you need, when you need it.  We recommend signing up for the patient portal called "MyChart".  Sign up information is provided on this After Visit Summary.  MyChart is used to connect with patients for Virtual Visits (Telemedicine).  Patients are able to view lab/test results, encounter notes, upcoming appointments, etc.  Non-urgent messages can be sent to your provider as well.   To learn more about what you can do with MyChart, go to [email or web address].    Your next appointment:   6 month(s)  The format for your next appointment:   In Person  Provider:   Belva Crome, MD   Other Instructions You need to see Dr. Elberta Fortis .

## 2020-01-18 NOTE — Progress Notes (Signed)
Cardiology Office Note:    Date:  01/18/2020   ID:  James Sanders, DOB 03-28-1956, MRN [id number]  PCP:  Libby Maw, MD  Cardiologist:  Jenean Lindau, MD   Referring MD: Libby Maw,*    ASSESSMENT:    1. Atrial fibrillation, unspecified type ([place])   2. Essential hypertension    PLAN:    In order of problems listed above:  1. Atrial fibrillation: Patient is currently in sinus rhythm.  As mentioned above he had history of cardiomyopathy.  He underwent atrial fibrillation ablation intervention and subsequently was initiated on anticoagulation and amiodarone therapy.  His CHADS2 score is 1.  I told him that we will continue his current medications at this time he is feeling fine.  I would like to get him established or at least have an initial evaluation with our electrophysiology colleagues to answer these questions about the need to continue amiodarone therapy in a gentleman of his age.  Also about the anticoagulation issue.  His CHADS2 score is only 1 and I am not too sure he needs anticoagulation for long-term.  He is agreeable to get this assessment. 2. Essential hypertension: Blood pressure is stable and diet was discussed and he promises to comply.  He has been lax with his exercise and I told him to walk at least 30 minutes a day 5 days a week and he promises to do so. 3. Patient will be seen in follow-up appointment in 6 months or earlier if the patient has any concerns    Medication Adjustments/Labs and Tests Ordered: Current medicines are reviewed at length with the patient today.  Concerns regarding medicines are outlined above.  Orders Placed This Encounter  Procedures  . Ambulatory referral to Cardiac Electrophysiology   No orders of the defined types were placed in this encounter.    Chief Complaint  Patient presents with  . Follow-up    1 month follow up. No complaints.     History of Present Illness:    James Sanders is a 64 y.o.  male.  He has past medical history of atrial fibrillation and has undergone ablation.  His ejection fraction was significantly depressed but subsequently normalized.  He denies any problems at this time and takes care of activities of daily living.  He was initiated on amiodarone by the [facility] physicians.  At the time of my evaluation, the patient is alert awake oriented and in no distress.  Past Medical History:  Diagnosis Date  . Atrial fibrillation ([place])   . Gout     Past Surgical History:  Procedure Laterality Date  . IR FLUORO GUIDED NEEDLE PLC ASPIRATION/INJECTION LOC  04/02/2019    Current Medications: Current Meds  Medication Sig  . allopurinol (ZYLOPRIM) 100 MG tablet TAKE 1 TABLET(100 MG) BY MOUTH DAILY  . amiodarone (PACERONE) 100 MG tablet Take 1 tablet (100 mg total) by mouth daily.  Marland Kitchen atorvastatin (LIPITOR) 40 MG tablet Take 1 tablet (40 mg total) by mouth at bedtime.  Marland Kitchen lisinopril (ZESTRIL) 20 MG tablet Take 20 mg by mouth daily.  . metoprolol succinate (TOPROL-XL) 25 MG 24 hr tablet Take 1 tablet (25 mg total) by mouth daily.  Marland Kitchen torsemide (DEMADEX) 20 MG tablet TAKE 1 TABLET(20 MG) BY MOUTH DAILY  . XARELTO 20 MG TABS tablet Take 1 tablet (20 mg total) by mouth every evening.     Allergies:   Patient has no known allergies.   Social History   Socioeconomic History  .  Marital status: Single    Spouse name: Not on file  . Number of children: Not on file  . Years of education: Not on file  . Highest education level: Not on file  Occupational History  . Not on file  Tobacco Use  . Smoking status: Former Games developer  . Smokeless tobacco: Never Used  Substance and Sexual Activity  . Alcohol use: Not Currently    Comment: social  . Drug use: Not Currently    Types: Marijuana  . Sexual activity: Not on file  Other Topics Concern  . Not on file  Social History Narrative  . Not on file   Social Determinants of Health   Financial Resource Strain:   . Difficulty of  Paying Living Expenses: Not on file  Food Insecurity:   . Worried About Programme researcher, broadcasting/film/video in the Last Year: Not on file  . Ran Out of Food in the Last Year: Not on file  Transportation Needs:   . Lack of Transportation (Medical): Not on file  . Lack of Transportation (Non-Medical): Not on file  Physical Activity:   . Days of Exercise per Week: Not on file  . Minutes of Exercise per Session: Not on file  Stress:   . Feeling of Stress : Not on file  Social Connections:   . Frequency of Communication with Friends and Family: Not on file  . Frequency of Social Gatherings with Friends and Family: Not on file  . Attends Religious Services: Not on file  . Active Member of Clubs or Organizations: Not on file  . Attends Banker Meetings: Not on file  . Marital Status: Not on file     Family History: The patient's family history is not on file.  ROS:   Please see the history of present illness.    All other systems reviewed and are negative.  EKGs/Labs/Other Studies Reviewed:    The following studies were reviewed today: I discussed my findings with the patient at length   Recent Labs: 12/03/2019: BUN 27; Creatinine, Ser 1.87; Hemoglobin 13.1; Platelets 215; Potassium 3.8; Sodium 141 12/10/2019: ALT 27 12/29/2019: TSH 1.43  Recent Lipid Panel No results found for: CHOL, TRIG, HDL, CHOLHDL, VLDL, LDLCALC, LDLDIRECT  Physical Exam:    VS:  BP 128/88 (BP Location: Right Leg, Patient Position: Sitting, Cuff Size: Normal)   Pulse (!) 58   Temp 98.1 F (36.7 C)   Resp 20   Ht 6' (1.829 m)   Wt 228 lb 8 oz (103.6 kg)   SpO2 98%   BMI 30.99 kg/m     Wt Readings from Last 3 Encounters:  01/18/20 228 lb 8 oz (103.6 kg)  12/10/19 219 lb (99.3 kg)  12/03/19 217 lb 6.4 oz (98.6 kg)     GEN: Patient is in no acute distress HEENT: Normal NECK: No JVD; No carotid bruits LYMPHATICS: No lymphadenopathy CARDIAC: Hear sounds regular, 2/6 systolic murmur at the apex.  RESPIRATORY:  Clear to auscultation without rales, wheezing or rhonchi  ABDOMEN: Soft, non-tender, non-distended MUSCULOSKELETAL:  No edema; No deformity  SKIN: Warm and dry NEUROLOGIC:  Alert and oriented x 3 PSYCHIATRIC:  Normal affect   Signed, Garwin Brothers, MD  01/18/2020 2:10 PM    [facility]

## 2020-01-28 ENCOUNTER — Other Ambulatory Visit: Payer: Self-pay | Admitting: Family Medicine

## 2020-01-28 DIAGNOSIS — E790 Hyperuricemia without signs of inflammatory arthritis and tophaceous disease: Secondary | ICD-10-CM

## 2020-01-28 DIAGNOSIS — M10471 Other secondary gout, right ankle and foot: Secondary | ICD-10-CM

## 2020-02-21 ENCOUNTER — Ambulatory Visit: Payer: BLUE CROSS/BLUE SHIELD | Admitting: Cardiology

## 2020-03-13 ENCOUNTER — Institutional Professional Consult (permissible substitution): Payer: BLUE CROSS/BLUE SHIELD | Admitting: Cardiology

## 2020-03-13 NOTE — Progress Notes (Deleted)
Electrophysiology Office Note   Date:  03/13/2020   ID:  James Sanders, DOB [birth year], MRN [id number]  PCP:  James Maw, MD  Cardiologist:  *** Primary Electrophysiologist: *** James Branden Meredith Leeds, MD    Chief Complaint: ***   History of Present Illness: James Sanders is a 64 y.o. male who is being seen today for the evaluation of *** at the request of Revankar, Reita Cliche, MD. Presenting today for electrophysiology evaluation.    Today, he denies*** symptoms of palpitations, chest pain, shortness of breath, orthopnea, PND, lower extremity edema, claudication, dizziness, presyncope, syncope, bleeding, or neurologic sequela. The patient is tolerating medications without difficulties.    Past Medical History:  Diagnosis Date  . Atrial fibrillation ([place])   . Gout    Past Surgical History:  Procedure Laterality Date  . IR FLUORO GUIDED NEEDLE PLC ASPIRATION/INJECTION LOC  04/02/2019     Current Outpatient Medications  Medication Sig Dispense Refill  . allopurinol (ZYLOPRIM) 100 MG tablet TAKE 1 TABLET(100 MG) BY MOUTH DAILY 30 tablet 1  . amiodarone (PACERONE) 100 MG tablet Take 1 tablet (100 mg total) by mouth daily. 30 tablet 4  . atorvastatin (LIPITOR) 40 MG tablet Take 1 tablet (40 mg total) by mouth at bedtime. 30 tablet 11  . lisinopril (ZESTRIL) 20 MG tablet Take 20 mg by mouth daily.    . metoprolol succinate (TOPROL-XL) 25 MG 24 hr tablet Take 1 tablet (25 mg total) by mouth daily. 30 tablet 3  . torsemide (DEMADEX) 20 MG tablet TAKE 1 TABLET(20 MG) BY MOUTH DAILY 30 tablet 0  . XARELTO 20 MG TABS tablet Take 1 tablet (20 mg total) by mouth every evening. 30 tablet 3   No current facility-administered medications for this visit.    Allergies:   Patient has no known allergies.   Social History:  The patient  reports that he has quit smoking. He has never used smokeless tobacco. He reports previous alcohol use. He reports previous drug use. Drug:  Marijuana.   Family History:  The patient's ***family history is not on file.    ROS:  Please see the history of present illness.   Otherwise, review of systems is positive for none.   All other systems are reviewed and negative.    PHYSICAL EXAM: VS:  There were no vitals taken for this visit. , BMI There is no height or weight on file to calculate BMI. GEN: Well nourished, well developed, in no acute distress  HEENT: normal  Neck: no JVD, carotid bruits, or masses Cardiac: ***RRR; no murmurs, rubs, or gallops,no edema  Respiratory:  clear to auscultation bilaterally, normal work of breathing GI: soft, nontender, nondistended, + BS MS: no deformity or atrophy  Skin: warm and dry, ***device pocket is well healed Neuro:  Strength and sensation are intact Psych: euthymic mood, full affect  EKG:  EKG {ACTION; IS/IS TDV:[id number]} ordered today. Personal review of the ekg ordered *** shows ***  ***Device interrogation is reviewed today in detail.  See PaceArt for details.   Recent Labs: 12/03/2019: BUN 27; Creatinine, Ser 1.87; Hemoglobin 13.1; Platelets 215; Potassium 3.8; Sodium 141 12/10/2019: ALT 27 12/29/2019: TSH 1.43    Lipid Panel  No results found for: CHOL, TRIG, HDL, CHOLHDL, VLDL, LDLCALC, LDLDIRECT   Wt Readings from Last 3 Encounters:  01/18/20 228 lb 8 oz (103.6 kg)  12/10/19 219 lb (99.3 kg)  12/03/19 217 lb 6.4 oz (98.6 kg)      Other studies  Reviewed: Additional studies/ records that were reviewed today include: CMRI  01/26/19 Review of the above records today demonstrates:  1. The left ventricle is top-normal in chamber size with moderate concentric LVH. Global systolic function is severely reduced. The LV ejection fraction is approximately 25% averaged over several heart beats (given irregular rhythm).  2. The right ventricle is normal in cavity size and wall thickness. There is mild global systolic dysfunction.  3. The left atrium is mildly enlarged,  the right atrium is moderately enlarged.  4. The aortic valve is trileaflet in morphology. There is normal opening of all cardiac valves. Evaluation of regurgitant lesions is suboptimal given irregular rhythm and the need to perform real-time imaging. However, there does not appear to be any significant regurgitant lesions.   5. Delayed enhancement imaging is mildly abnormal. There is no evidence of MI, or infiltrative disease, but there is a patchy fibrosis in the midwall of the basal anteroseptum and inferoseptum. This is a nonspecific finding, but can be found in idiopathic dilated cardiomyopathy.  6. Adenosine stress perfusion imaging demonstrates mild inferoapical ischemia.   7. No intracardiac thrombus  TTE 01/21/19  SEVERE LV DYSFUNCTION (See above) WITH MILD LVH  MILD RV SYSTOLIC DYSFUNCTION (See above)  VALVULAR REGURGITATION: TRIVIAL AR, MODERATE MR, MILD PR, MILD TR  NO VALVULAR STENOSIS  AFIB WITH RVR THROUGHOUT STUDY  ASSESSMENT AND PLAN:  1.  ***    Current medicines are reviewed at length with the patient today.   The patient {ACTIONS; HAS/DOES NOT HAVE:19233} concerns regarding his medicines.  The following changes were made today:  {NONE DEFAULTED:18576::"none"}  Labs/ tests ordered today include: *** No orders of the defined types were placed in this encounter.    Disposition:   FU with Laiya Wisby {gen number [id number]} {Days to years:10300}  Signed, Oanh Devivo Jorja Loa, MD  03/13/2020 8:33 AM     [facility] [address] [place] [address] [phone] (office) [phone] (fax)

## 2020-04-05 ENCOUNTER — Telehealth: Payer: Self-pay | Admitting: Family Medicine

## 2020-04-05 ENCOUNTER — Other Ambulatory Visit: Payer: Self-pay

## 2020-04-05 DIAGNOSIS — M10471 Other secondary gout, right ankle and foot: Secondary | ICD-10-CM

## 2020-04-05 DIAGNOSIS — E790 Hyperuricemia without signs of inflammatory arthritis and tophaceous disease: Secondary | ICD-10-CM

## 2020-04-05 MED ORDER — ALLOPURINOL 100 MG PO TABS: ORAL_TABLET | ORAL | 1 refills | Status: DC

## 2020-04-05 NOTE — Telephone Encounter (Signed)
Patient is calling and requesting a refill for allopurinol sent to [facility] on [address]. CB is [phone].

## 2020-04-05 NOTE — Telephone Encounter (Signed)
Refill sent in

## 2020-04-26 ENCOUNTER — Encounter: Payer: BLUE CROSS/BLUE SHIELD | Admitting: Family Medicine

## 2020-04-26 ENCOUNTER — Encounter: Payer: Self-pay | Admitting: Family Medicine

## 2020-04-27 ENCOUNTER — Institutional Professional Consult (permissible substitution): Payer: BLUE CROSS/BLUE SHIELD | Admitting: Cardiology

## 2020-04-28 ENCOUNTER — Encounter: Payer: Self-pay | Admitting: Family Medicine

## 2020-06-05 ENCOUNTER — Institutional Professional Consult (permissible substitution): Payer: BLUE CROSS/BLUE SHIELD | Admitting: Cardiology

## 2020-06-05 ENCOUNTER — Telehealth: Payer: Self-pay | Admitting: Family Medicine

## 2020-06-05 NOTE — Progress Notes (Deleted)
Electrophysiology Office Note   Date:  06/05/2020   ID:  James Sanders, DOB 1956-10-26, MRN [id number]  PCP:  Mliss Sax, MD  Cardiologist:  *** Primary Electrophysiologist: *** Teela Narducci Jorja Loa, MD    Chief Complaint: ***   History of Present Illness: James Sanders is a 64 y.o. male who is being seen today for the evaluation of *** at the request of Revankar, Aundra Dubin, MD. Presenting today for electrophysiology evaluation.  He has a history significant for atrial fibrillation and hypertension.  He has previously undergone ablation.  He had a reduced ejection fraction, but this has apparently normalized since his ablation was performed.  He is on amiodarone, initiated by physicians at [facility]  He wore a cardiac monitor in May which apparently showed an atrial fibrillation burden of 7%.  Unfortunately his DLCO has been low.  Today, he denies*** symptoms of palpitations, chest pain, shortness of breath, orthopnea, PND, lower extremity edema, claudication, dizziness, presyncope, syncope, bleeding, or neurologic sequela. The patient is tolerating medications without difficulties.    Past Medical History:  Diagnosis Date   Atrial fibrillation ([facility])    Gout    Past Surgical History:  Procedure Laterality Date   IR FLUORO GUIDED NEEDLE PLC ASPIRATION/INJECTION LOC  04/02/2019     Current Outpatient Medications  Medication Sig Dispense Refill   allopurinol (ZYLOPRIM) 100 MG tablet TAKE 1 TABLET(100 MG) BY MOUTH DAILY 30 tablet 1   amiodarone (PACERONE) 100 MG tablet Take 1 tablet (100 mg total) by mouth daily. 30 tablet 4   atorvastatin (LIPITOR) 40 MG tablet Take 1 tablet (40 mg total) by mouth at bedtime. 30 tablet 11   lisinopril (ZESTRIL) 20 MG tablet Take 20 mg by mouth daily.     metoprolol succinate (TOPROL-XL) 25 MG 24 hr tablet Take 1 tablet (25 mg total) by mouth daily. 30 tablet 3   torsemide (DEMADEX) 20 MG tablet TAKE 1 TABLET(20 MG) BY MOUTH  DAILY 30 tablet 0   XARELTO 20 MG TABS tablet Take 1 tablet (20 mg total) by mouth every evening. 30 tablet 3   No current facility-administered medications for this visit.    Allergies:   Patient has no known allergies.   Social History:  The patient  reports that he has quit smoking. He has never used smokeless tobacco. He reports previous alcohol use. He reports previous drug use. Drug: Marijuana.   Family History:  The patient's ***family history is not on file.    ROS:  Please see the history of present illness.   Otherwise, review of systems is positive for none.   All other systems are reviewed and negative.    PHYSICAL EXAM: VS:  There were no vitals taken for this visit. , BMI There is no height or weight on file to calculate BMI. GEN: Well nourished, well developed, in no acute distress  HEENT: normal  Neck: no JVD, carotid bruits, or masses Cardiac: ***RRR; no murmurs, rubs, or gallops,no edema  Respiratory:  clear to auscultation bilaterally, normal work of breathing GI: soft, nontender, nondistended, + BS MS: no deformity or atrophy  Skin: warm and dry Neuro:  Strength and sensation are intact Psych: euthymic mood, full affect  EKG:  EKG {ACTION; IS/IS IFO:[id number]} ordered today. Personal review of the ekg ordered *** shows ***  Recent Labs: 12/03/2019: BUN 27; Creatinine, Ser 1.87; Hemoglobin 13.1; Platelets 215; Potassium 3.8; Sodium 141 12/10/2019: ALT 27 12/29/2019: TSH 1.43    Lipid Panel  No  results found for: CHOL, TRIG, HDL, CHOLHDL, VLDL, LDLCALC, LDLDIRECT   Wt Readings from Last 3 Encounters:  01/18/20 228 lb 8 oz (103.6 kg)  12/10/19 219 lb (99.3 kg)  12/03/19 217 lb 6.4 oz (98.6 kg)      Other studies Reviewed: Additional studies/ records that were reviewed today include: TTE 05/06/19  Review of the above records today demonstrates:  MILD LV DYSFUNCTION (See above) WITH MILD LVH  NORMAL RIGHT VENTRICULAR SYSTOLIC FUNCTION  NO DOPPLER  PERFORMED FOR VALVULAR REGURGITATION  NO DOPPLER PERFORMED FOR VALVULAR STENOSIS  LIMITED ECHO TO ASSESS LV AND RV FUNCTION  Ejection fraction 50%   ASSESSMENT AND PLAN:  1.  Paroxysmal atrial fibrillation: Currently on amiodarone and Xarelto.  CHA2DS2-VASc of 2.  He is unfortunately had more atrial fibrillation on cardiac monitor.  He is young and would be good to be able to stop amiodarone.***  2.  Heart failure with reduced ejection fraction: Ejection fraction has fortunately improved after ablation.**  3.  Hypertension:***    Current medicines are reviewed at length with the patient today.   The patient {ACTIONS; HAS/DOES NOT HAVE:19233} concerns regarding his medicines.  The following changes were made today:  {NONE DEFAULTED:18576::"none"}  Labs/ tests ordered today include: *** No orders of the defined types were placed in this encounter.    Disposition:   FU with Jermiyah Ricotta {gen number [id number]} {Days to years:10300}  Signed, Julyanna Scholle Jorja Loa, MD  06/05/2020 8:37 AM     [facility] [address] [place] [address] [phone] (office) [phone] (fax)

## 2020-06-05 NOTE — Telephone Encounter (Signed)
Called patient to reschedule missed appt from 04/26/20. Left voicemail to call the office to schedule.

## 2020-06-06 ENCOUNTER — Telehealth: Payer: Self-pay

## 2020-06-06 NOTE — Telephone Encounter (Signed)
Follow up   Pt is returning call. Per notes gave info about EP doctors but he still wanted to speak with [place]

## 2020-06-06 NOTE — Telephone Encounter (Signed)
Message left for patient to callback to help identify who is in network for referral to EP.

## 2020-06-06 NOTE — Telephone Encounter (Signed)
-----   Message from Garwin Brothers, MD sent at 06/06/2020  1:43 PM EDT ----- Regarding: RE: James Sanders please do needful. Find out about his network and refer please ----- Message ----- From: Baird Lyons, RN Sent: 06/05/2020  10:39 AM EDT To: Garwin Brothers, MD, Eleonore Chiquito, RN Subject: James Sanders -   Pt was scheduled for consult w/ [place] today for AFib. Pt rescheduled, however - he is not in network so [facility] will call pt to inform him.   Per billing :  03-02-20  Pt is OUT OF NETWORK...we do not participate with the part of [employer]....pamr

## 2020-06-07 ENCOUNTER — Other Ambulatory Visit: Payer: Self-pay | Admitting: Family Medicine

## 2020-06-07 DIAGNOSIS — E790 Hyperuricemia without signs of inflammatory arthritis and tophaceous disease: Secondary | ICD-10-CM

## 2020-06-07 DIAGNOSIS — M10471 Other secondary gout, right ankle and foot: Secondary | ICD-10-CM

## 2020-06-07 NOTE — Telephone Encounter (Signed)
Attempted to call but no place to leave a message.

## 2020-07-02 ENCOUNTER — Other Ambulatory Visit: Payer: Self-pay | Admitting: Family Medicine

## 2020-07-02 DIAGNOSIS — M10471 Other secondary gout, right ankle and foot: Secondary | ICD-10-CM

## 2020-07-02 DIAGNOSIS — E790 Hyperuricemia without signs of inflammatory arthritis and tophaceous disease: Secondary | ICD-10-CM

## 2020-07-13 ENCOUNTER — Institutional Professional Consult (permissible substitution): Payer: BLUE CROSS/BLUE SHIELD | Admitting: Cardiology

## 2020-07-13 ENCOUNTER — Encounter: Payer: Self-pay | Admitting: Cardiology

## 2020-08-01 ENCOUNTER — Institutional Professional Consult (permissible substitution): Payer: BLUE CROSS/BLUE SHIELD | Admitting: Cardiology

## 2020-08-01 NOTE — Telephone Encounter (Signed)
Called and left a VM for pt to callback. Pt needs to make an appointment to be seen.

## 2020-08-01 NOTE — Progress Notes (Deleted)
Electrophysiology Office Note   Date:  08/01/2020   ID:  James Sanders, DOB 08/20/1956, MRN [id number]  PCP:  James Sax, MD  Cardiologist: James Sanders Primary Electrophysiologist:  James Lipa Jorja Loa, MD    Chief Complaint: AF   History of Present Illness: James Sanders is a 64 y.o. male who is being seen today for the evaluation of AF at the request of James Sanders, Aundra Dubin, MD. Presenting today for electrophysiology evaluation.  He has a history significant for hypertension, atrial fibrillation, and renal dysfunction.  He was diagnosed with atrial fibrillation with rapid rates in March 2020.  At that time, he had a cardiac MRI that showed an ejection fraction of 20%.  He was put on Xarelto and amiodarone.  He had a repeat echo that showed an ejection fraction of almost 20%.  Holter monitor May 2020 showed an AF burden of 7%.  He was asymptomatic during those episodes.  Today, he denies*** symptoms of palpitations, chest pain, shortness of breath, orthopnea, PND, lower extremity edema, claudication, dizziness, presyncope, syncope, bleeding, or neurologic sequela. The patient is tolerating medications without difficulties.    Past Medical History:  Diagnosis Date  . Atrial fibrillation (HCC)   . Gout    Past Surgical History:  Procedure Laterality Date  . IR FLUORO GUIDED NEEDLE PLC ASPIRATION/INJECTION LOC  04/02/2019     Current Outpatient Medications  Medication Sig Dispense Refill  . allopurinol (ZYLOPRIM) 100 MG tablet TAKE 1 TABLET(100 MG) BY MOUTH DAILY 30 tablet 1  . amiodarone (PACERONE) 100 MG tablet Take 1 tablet (100 mg total) by mouth daily. 30 tablet 4  . atorvastatin (LIPITOR) 40 MG tablet Take 1 tablet (40 mg total) by mouth at bedtime. 30 tablet 11  . lisinopril (ZESTRIL) 20 MG tablet Take 20 mg by mouth daily.    . metoprolol succinate (TOPROL-XL) 25 MG 24 hr tablet Take 1 tablet (25 mg total) by mouth daily. 30 tablet 3  . torsemide (DEMADEX) 20  MG tablet TAKE 1 TABLET(20 MG) BY MOUTH DAILY 30 tablet 0  . XARELTO 20 MG TABS tablet Take 1 tablet (20 mg total) by mouth every evening. 30 tablet 3   No current facility-administered medications for this visit.    Allergies:   Patient has no known allergies.   Social History:  The patient  reports that he has quit smoking. He has never used smokeless tobacco. He reports previous alcohol use. He reports previous drug use. Drug: Marijuana.   Family History:  The patient's ***family history is not on file.    ROS:  Please see the history of present illness.   Otherwise, review of systems is positive for none.   All other systems are reviewed and negative.    PHYSICAL EXAM: VS:  There were no vitals taken for this visit. , BMI There is no height or weight on file to calculate BMI. GEN: Well nourished, well developed, in no acute distress  HEENT: normal  Neck: no JVD, carotid bruits, or masses Cardiac: ***RRR; no murmurs, rubs, or gallops,no edema  Respiratory:  clear to auscultation bilaterally, normal work of breathing GI: soft, nontender, nondistended, + BS MS: no deformity or atrophy  Skin: warm and dry, ***device pocket is well healed Neuro:  Strength and sensation are intact Psych: euthymic mood, full affect  EKG:  EKG {ACTION; IS/IS TDV:[id number]} ordered today. Personal review of the ekg ordered *** shows ***  ***Device interrogation is reviewed today in detail.  See PaceArt for  details.   Recent Labs: 12/03/2019: BUN 27; Creatinine, Ser 1.87; Hemoglobin 13.1; Platelets 215; Potassium 3.8; Sodium 141 12/10/2019: ALT 27 12/29/2019: TSH 1.43    Lipid Panel  No results found for: CHOL, TRIG, HDL, CHOLHDL, VLDL, LDLCALC, LDLDIRECT   Wt Readings from Last 3 Encounters:  01/18/20 228 lb 8 oz (103.6 kg)  12/10/19 219 lb (99.3 kg)  12/03/19 217 lb 6.4 oz (98.6 kg)      Other studies Reviewed: Additional studies/ records that were reviewed today include: Cardiac MRI  01/26/2019 Review of the above records today demonstrates:  1. The left ventricle is top-normal in chamber sizewith moderate concentric LVH. Global systolic function  is severely reduced. The LV ejection fraction is approximately 25% averaged over several heart beats (given  irregular rhythm).   2. The right ventricle is normal in cavity size and wall thickness. There is mild global systolic  dysfunction.   3. The left atrium is mildly enlarged, the right atrium is moderately enlarged.   4. The aortic valve is trileaflet in morphology. There is normal opening of all cardiac valves. Evaluation  of regurgitantlesions is suboptimal given irregular rhythm and the need to perform real-time imaging.  However, there does not appear to be any significant regurgitant lesions.   5. Delayed enhancement imaging is mildly abnormal. There is no evidence of MI, or infiltrative disease,  but there is a patchy fibrosis in the midwall of the basal anteroseptum and inferoseptum. This is a  nonspecific finding, but can be found in idiopathic dilated cardiomyopathy.   6. Adenosine stress perfusion imaging demonstrates mild inferoapical ischemia.   7. No intracardiac thrombus.   TTE 05/06/2019 MILD LV DYSFUNCTION (See above) WITH MILD LVH Ejection fraction 50% NORMAL RIGHT VENTRICULAR SYSTOLIC FUNCTION  NO DOPPLER PERFORMED FOR VALVULAR REGURGITATION  NO DOPPLER PERFORMED FOR VALVULAR STENOSIS  LIMITED ECHO TO ASSESS LV AND RV FUNCTION    ASSESSMENT AND PLAN:  1.  Persistent atrial fibrillation: Currently on amiodarone, Toprol-XL, and Xarelto.  CHA2DS2-VASc of 2.***  2.  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  His ejection fraction is improved since his initial cardiac MRI.  Continue lisinopril and Toprol-XL.  Maintenance of sinus rhythm Maryruth Apple be quite important.  3.  Hypertension:***  Current medicines are reviewed at length with the patient  today.   The patient {ACTIONS; HAS/DOES NOT HAVE:19233} concerns regarding his medicines.  The following changes were made today:  {NONE DEFAULTED:18576::"none"}  Labs/ tests ordered today include: *** No orders of the defined types were placed in this encounter.    Disposition:   FU with Kathee Tumlin {gen number [id number]:[id number]} {Days to years:10300}  Signed, Zamere Pasternak Jorja Loa, MD  08/01/2020 8:12 AM     [facility] [address] [place] [address] [phone] (office) [phone] (fax)

## 2020-08-07 ENCOUNTER — Other Ambulatory Visit: Payer: Self-pay | Admitting: Cardiology

## 2020-08-08 NOTE — Telephone Encounter (Signed)
Called patient to schedule appointment for follow up, no answer LMTCB.

## 2020-08-11 ENCOUNTER — Other Ambulatory Visit: Payer: Self-pay | Admitting: Cardiology

## 2020-08-11 MED ORDER — AMIODARONE HCL 100 MG PO TABS: 100.0000 mg | ORAL_TABLET | Freq: Every day | ORAL | 5 refills | Status: DC

## 2020-08-24 ENCOUNTER — Ambulatory Visit: Payer: BLUE CROSS/BLUE SHIELD | Admitting: Family Medicine

## 2020-08-29 ENCOUNTER — Other Ambulatory Visit: Payer: Self-pay

## 2020-08-30 ENCOUNTER — Ambulatory Visit: Payer: BLUE CROSS/BLUE SHIELD | Admitting: Family Medicine

## 2020-08-31 ENCOUNTER — Other Ambulatory Visit: Payer: Self-pay | Admitting: Cardiology

## 2020-09-04 ENCOUNTER — Other Ambulatory Visit: Payer: Self-pay

## 2020-09-04 MED ORDER — AMIODARONE HCL 100 MG PO TABS: ORAL_TABLET | ORAL | 0 refills | Status: DC

## 2020-09-04 NOTE — Telephone Encounter (Signed)
Refill sent in for Pacerone to [facility].  Rx that was sent in on 09-01-20 did not transmit to the pharmacy.

## 2020-09-12 ENCOUNTER — Telehealth: Payer: Self-pay | Admitting: Family Medicine

## 2020-09-12 NOTE — Telephone Encounter (Signed)
Patient is calling and requesting a refill for Pacerone and torseminde sent to [facility] on [facility], please advise. CB is [phone]

## 2020-09-15 ENCOUNTER — Other Ambulatory Visit: Payer: Self-pay | Admitting: Cardiology

## 2020-09-15 ENCOUNTER — Other Ambulatory Visit: Payer: Self-pay | Admitting: Family Medicine

## 2020-09-15 DIAGNOSIS — E790 Hyperuricemia without signs of inflammatory arthritis and tophaceous disease: Secondary | ICD-10-CM

## 2020-09-15 DIAGNOSIS — M10471 Other secondary gout, right ankle and foot: Secondary | ICD-10-CM

## 2020-09-15 MED ORDER — TORSEMIDE 20 MG PO TABS: ORAL_TABLET | ORAL | 0 refills | Status: DC

## 2020-09-15 NOTE — Telephone Encounter (Signed)
MEDICATION(S): torsemide (DEMADEX) 20 MG tablet - pt is out  Last filled 11/23/19 #30 no refills by Dr. Doreene Burke  I advised pt that PACERONE was sent in by cardiologist.  Has the patient contacted their pharmacy? Yes.    Preferred Pharmacy:  [id number] [place], [place] - [address] AT [facility] OF [facility] [address] Phone:  [phone]  Fax:  [phone]

## 2020-09-15 NOTE — Telephone Encounter (Signed)
Please review prior messages and advise.  Thanks.  Dm/cma

## 2020-09-15 NOTE — Telephone Encounter (Signed)
Sent 30 pills to pharmacy for him and asked them to contact cardiology for future refills.

## 2020-09-18 ENCOUNTER — Encounter: Payer: Self-pay | Admitting: Family Medicine

## 2020-09-18 ENCOUNTER — Telehealth: Payer: Self-pay | Admitting: Family Medicine

## 2020-09-18 NOTE — Telephone Encounter (Signed)
lft vm that RX was sent with 1 month no refills and that next refill has to come from cardiologist office.  Dm/cma

## 2020-09-18 NOTE — Telephone Encounter (Signed)
Pt was no show for appt 08/30/2020. Pt has rescheduled for 11/21/20. 2nd occurrence. Fee will be charged. Letter mailed.

## 2020-09-19 ENCOUNTER — Other Ambulatory Visit: Payer: Self-pay | Admitting: Cardiology

## 2020-10-17 ENCOUNTER — Other Ambulatory Visit: Payer: Self-pay | Admitting: Family Medicine

## 2020-10-17 DIAGNOSIS — M10471 Other secondary gout, right ankle and foot: Secondary | ICD-10-CM

## 2020-10-17 DIAGNOSIS — E790 Hyperuricemia without signs of inflammatory arthritis and tophaceous disease: Secondary | ICD-10-CM

## 2020-10-17 NOTE — Telephone Encounter (Signed)
Last OV 12/03/19 Last fill 09/15/20  #30/1

## 2020-10-23 ENCOUNTER — Other Ambulatory Visit: Payer: Self-pay | Admitting: Family Medicine

## 2020-11-21 ENCOUNTER — Ambulatory Visit: Payer: BLUE CROSS/BLUE SHIELD | Admitting: Family Medicine

## 2020-11-26 ENCOUNTER — Other Ambulatory Visit: Payer: Self-pay | Admitting: Family

## 2020-12-01 ENCOUNTER — Other Ambulatory Visit: Payer: Self-pay | Admitting: Family Medicine

## 2020-12-01 ENCOUNTER — Telehealth: Payer: Self-pay | Admitting: Family Medicine

## 2020-12-01 MED ORDER — ATORVASTATIN CALCIUM 40 MG PO TABS: 40.0000 mg | ORAL_TABLET | Freq: Every day | ORAL | 0 refills | Status: DC

## 2020-12-01 NOTE — Telephone Encounter (Signed)
rx sent to the [facility].  Dm/cma

## 2020-12-01 NOTE — Telephone Encounter (Signed)
Patient is calling to get a refill on his Atorvastatin 40mg . He has been out for 3 days. If approved, please send to [facility] on [address] [place], [place]. Call patient at  [phone] if you have any questions.

## 2020-12-08 ENCOUNTER — Ambulatory Visit: Payer: BLUE CROSS/BLUE SHIELD | Admitting: Family Medicine

## 2020-12-17 ENCOUNTER — Other Ambulatory Visit: Payer: Self-pay | Admitting: Cardiology

## 2020-12-18 NOTE — Telephone Encounter (Signed)
Rx refill sent to pharmacy. 

## 2020-12-25 ENCOUNTER — Other Ambulatory Visit: Payer: Self-pay | Admitting: Family Medicine

## 2021-01-15 ENCOUNTER — Ambulatory Visit: Payer: BLUE CROSS/BLUE SHIELD | Admitting: Family Medicine

## 2021-01-19 ENCOUNTER — Other Ambulatory Visit: Payer: Self-pay | Admitting: Family Medicine

## 2021-01-19 DIAGNOSIS — E790 Hyperuricemia without signs of inflammatory arthritis and tophaceous disease: Secondary | ICD-10-CM

## 2021-01-19 DIAGNOSIS — M10471 Other secondary gout, right ankle and foot: Secondary | ICD-10-CM

## 2021-03-05 ENCOUNTER — Ambulatory Visit: Payer: BLUE CROSS/BLUE SHIELD | Admitting: Family Medicine

## 2021-03-05 DIAGNOSIS — Z0289 Encounter for other administrative examinations: Secondary | ICD-10-CM

## 2021-05-01 ENCOUNTER — Other Ambulatory Visit: Payer: Self-pay | Admitting: Family Medicine

## 2021-05-01 DIAGNOSIS — E790 Hyperuricemia without signs of inflammatory arthritis and tophaceous disease: Secondary | ICD-10-CM

## 2021-05-01 DIAGNOSIS — M10471 Other secondary gout, right ankle and foot: Secondary | ICD-10-CM

## 2021-05-15 IMAGING — DX DG CHEST 2V
2 series · 2 of 2 positions shown · non-contrast
Comparison: No priors.

CLINICAL DATA: 63-year-old male on amiodarone therapy.

EXAM:
CHEST - 2 VIEW

[chest pa]
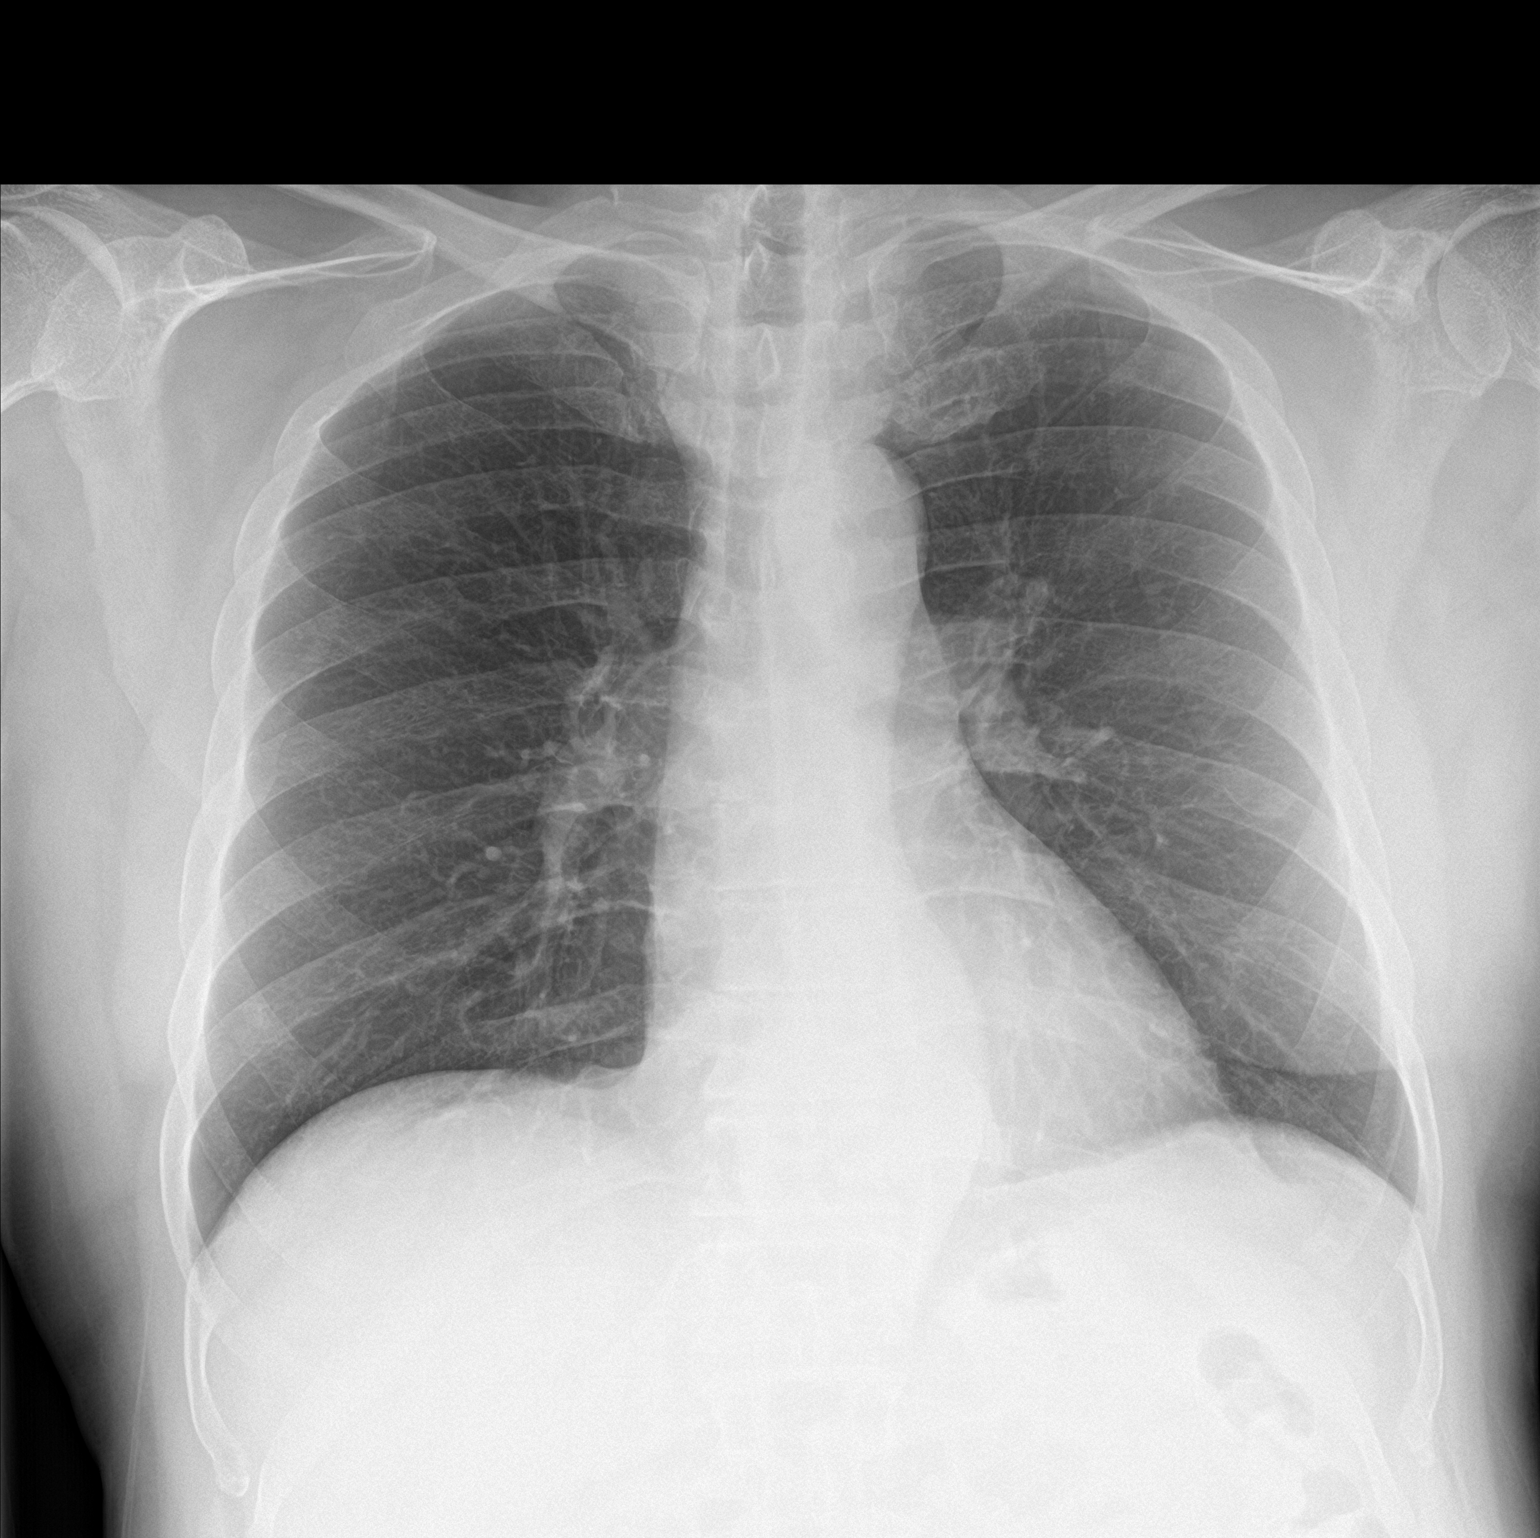

[chest lat]
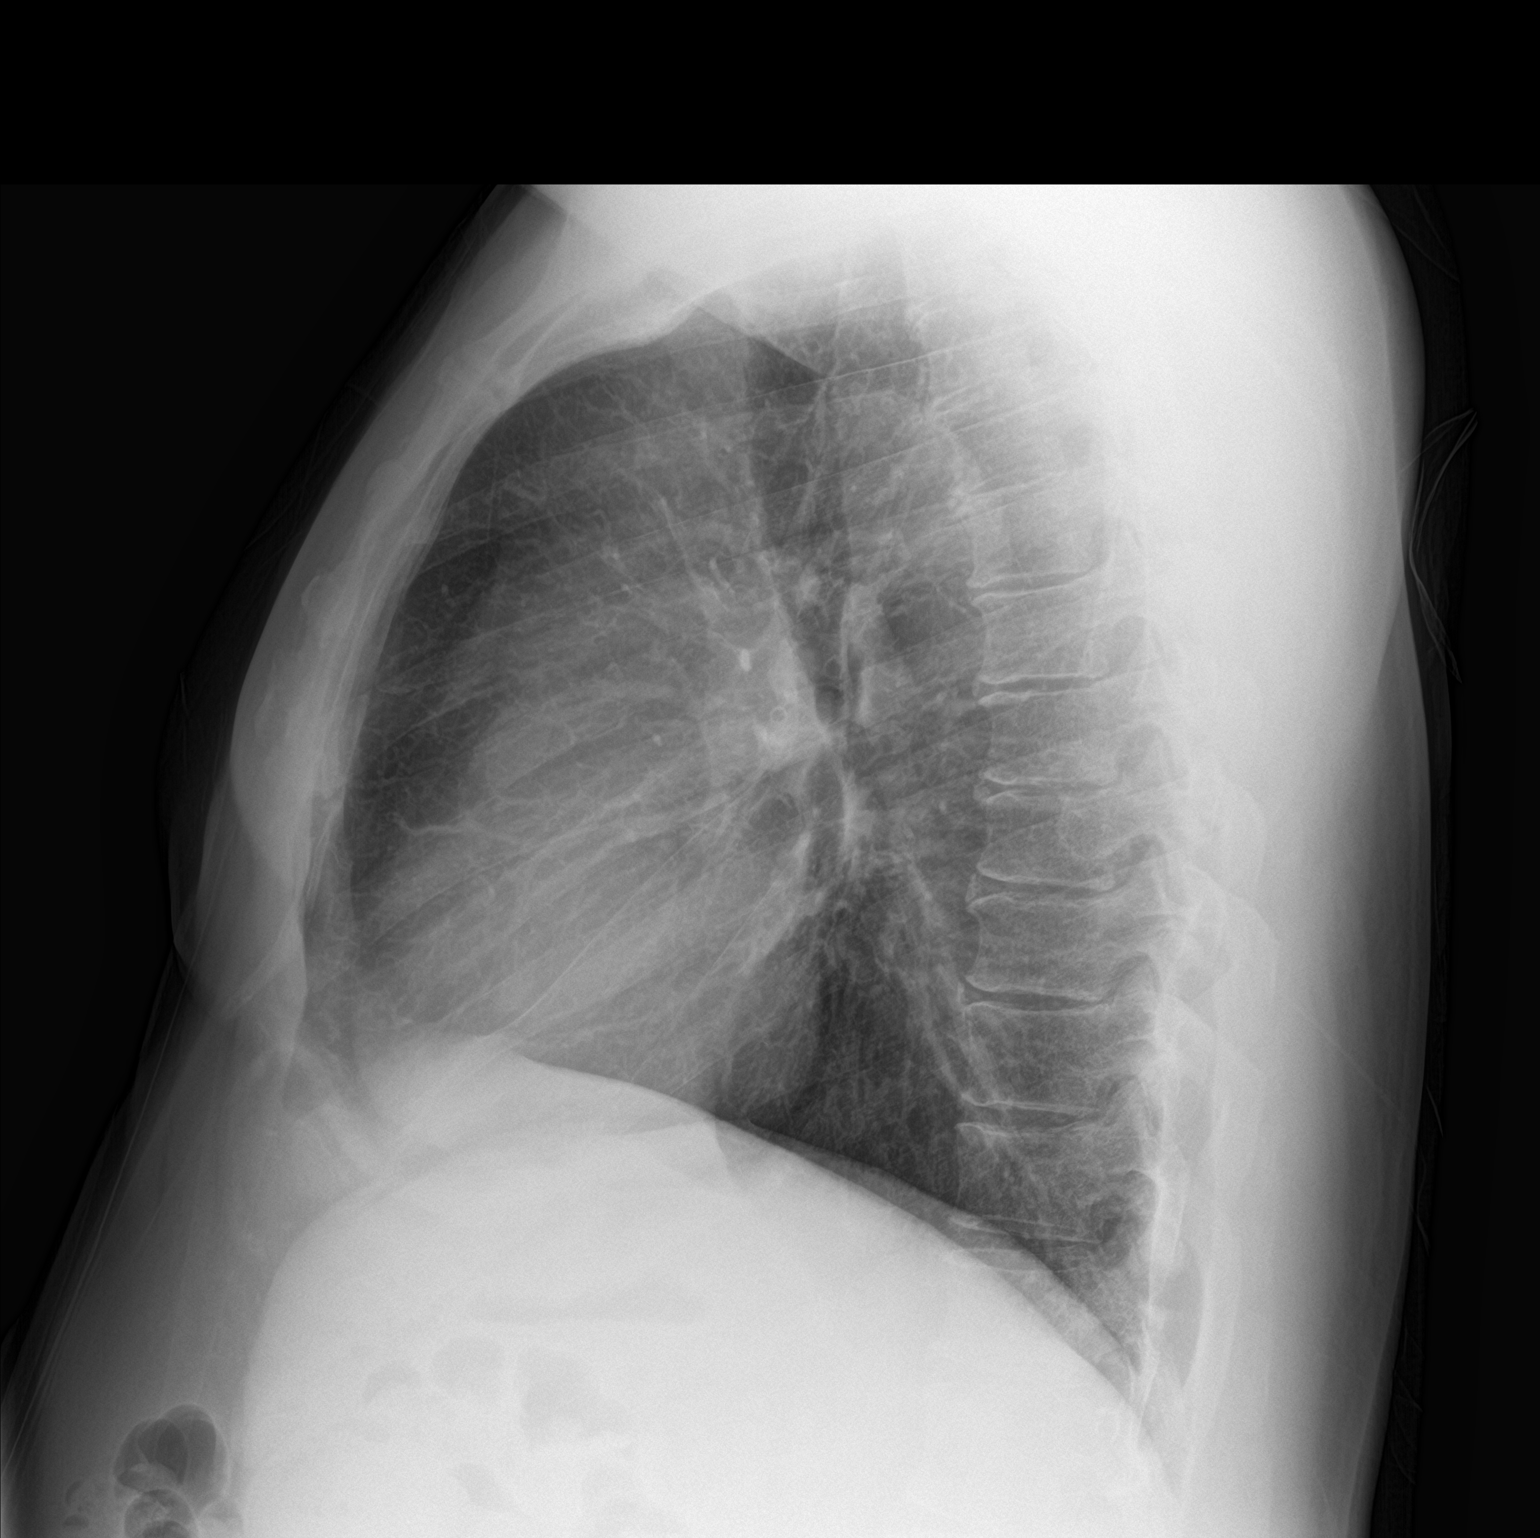

[2 of 2 positions shown; findings below may reference images not displayed]

FINDINGS: Lung volumes are normal. No consolidative airspace disease. No
pleural effusions. No pneumothorax. No pulmonary nodule or mass
noted. Pulmonary vasculature and the cardiomediastinal silhouette
are within normal limits.
IMPRESSION: No radiographic evidence of acute cardiopulmonary disease.

## 2021-08-02 ENCOUNTER — Encounter: Payer: Self-pay | Admitting: Family Medicine

## 2023-01-01 NOTE — Progress Notes (Signed)
This encounter was created in error - please disregard.
